# Patient Record
Sex: Male | Born: 2000 | Race: White | Hispanic: No | Marital: Single | Smoking: Current some day smoker
Health system: Southern US, Community
[De-identification: ages and names within clinical notes are randomized; demographics above are authoritative.]

## PROBLEM LIST (undated history)

## (undated) HISTORY — PX: MOUTH SURGERY: SHX715

---
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
## 2019-01-31 ENCOUNTER — Encounter: Payer: Self-pay | Admitting: Emergency Medicine

## 2019-01-31 ENCOUNTER — Other Ambulatory Visit: Payer: Self-pay

## 2019-01-31 ENCOUNTER — Ambulatory Visit
Admission: EM | Admit: 2019-01-31 | Discharge: 2019-01-31 | Disposition: A | Discharge status: Home / Self Care | Payer: Managed Care, Other (non HMO) | Attending: Urgent Care | Admitting: Urgent Care

## 2019-01-31 DIAGNOSIS — R5383 Other fatigue: Secondary | ICD-10-CM

## 2019-01-31 DIAGNOSIS — R51 Headache: Secondary | ICD-10-CM

## 2019-01-31 DIAGNOSIS — B349 Viral infection, unspecified: Secondary | ICD-10-CM

## 2019-01-31 DIAGNOSIS — Z7189 Other specified counseling: Secondary | ICD-10-CM

## 2019-01-31 DIAGNOSIS — R42 Dizziness and giddiness: Secondary | ICD-10-CM

## 2019-01-31 DIAGNOSIS — Z1159 Encounter for screening for other viral diseases: Secondary | ICD-10-CM

## 2019-01-31 DIAGNOSIS — R112 Nausea with vomiting, unspecified: Secondary | ICD-10-CM

## 2019-01-31 MED ORDER — ONDANSETRON 4 MG PO TBDP: 4.0000 mg | ORAL_TABLET | Freq: Three times a day (TID) | ORAL | 0 refills | Status: DC | PRN

## 2019-01-31 NOTE — Discharge Instructions (Addendum)
It was very nice seeing you today in clinic. Thank you for entrusting me with your care.   Rest and increase fluid intake as much as possible. Water is always best, as sugar and caffeine containing fluids can cause you to become dehydrated. Try to incorporate electrolyte enriched fluids, such as Gatorade or Pedialyte, into your daily fluid intake.  I have sent you some medication in for your nausea.   You were tested for SARS-CoV-2 (novel coronavirus) today. You need to self quarantine at home until negative tested results have been received.   Make arrangements to follow up with your regular doctor in 1 week for re-evaluation if not improving. If your symptoms/condition worsens, please seek follow up care either here or in the ER. Please remember, our [place] providers are "right here with you" when you need [place].   Again, it was my pleasure to take care of you today. Thank you for choosing our clinic. I hope that you start to feel better quickly.   Honor Loh, MSN, APRN, FNP-C, CEN Advanced Practice Provider [facility]

## 2019-01-31 NOTE — ED Provider Notes (Signed)
[place], [place]   Name: Mark Horne DOB: 10-Aug-2000 MRN: [id number] CSN: [id number] PCP: Clista BernhardtPa, [facility]  Arrival date and time:  01/31/19 0816  Chief Complaint:  Emesis, Dizziness, and Loss of Consciousness   NOTE: Prior to seeing the patient today, I have reviewed the triage nursing documentation and vital signs. Clinical staff has updated patient's PMH/PSHx, current medication list, and drug allergies/intolerances to ensure comprehensive history available to assist in medical decision making.   History:   HPI: Mark MoleJesse G Horne is a 18 y.o. male who presents today with complaints of "just not feeling good" that began with acute onset around 0 130 this morning.  Patient reports that he is dizzy and lightheaded.  He has experienced nausea and 4-5 episodes of vomiting.  He denies any diarrhea or fevers.  Patient reports that he was sitting in the chair at work and "blacked out" for what he feels was around 10 minutes. He did not fall to the floor or hit his head.  Patient complains of headache behind his eyes and slight pain in his RIGHT ear.  He denies any associated chest pain, shortness of breath, or palpitations.  Feels as if symptoms are related to his work schedule.  He works third shift and then directly goes to a first shift job 7 days a week.  He is only sleeping 3 to 4 hours a day.  Patient denies any sick contacts.  He initially refuses to be tested for SARS-CoV-2 (novel coronavirus) finding that he does not have insurance.  History reviewed. No pertinent past medical history.  Past Surgical History:  Procedure Laterality Date  . MOUTH SURGERY      Family History  Problem Relation Age of Onset  . Healthy Mother   . CAD Father 1638       CABG x 4  . Hypertension Father   . CAD Paternal Grandfather 465       CABG x 2  . CAD Paternal Uncle        CABG x 1    Social History   Tobacco Use  . Smoking status: Current Some Day Smoker    Packs/day: 1.00    Types:  Cigarettes  . Smokeless tobacco: Former Engineer, waterUser  Substance Use Topics  . Alcohol use: Yes    Alcohol/week: 2.0 standard drinks    Types: 2 Cans of beer per week    Comment: rarely  . Drug use: Never    There are no active problems to display for this patient.   Home Medications:    No outpatient medications have been marked as taking for the 01/31/19 encounter [facility](Hospital Encounter).    Allergies:   Nickel  Review of Systems (ROS): Review of Systems  Constitutional: Positive for fatigue. Negative for chills and fever.  HENT: Positive for ear pain. Negative for congestion, rhinorrhea, sinus pressure, sinus pain and sore throat.   Eyes: Negative for visual disturbance.  Respiratory: Negative for cough and shortness of breath.   Cardiovascular: Negative for chest pain and palpitations.  Gastrointestinal: Positive for nausea and vomiting. Negative for abdominal pain and diarrhea.  Neurological: Positive for dizziness, syncope (did not fall to floor; did not hit head), light-headedness and headaches.  Psychiatric/Behavioral: Positive for sleep disturbance (2/2 work schedule).  All other systems reviewed and are negative.    Vital Signs: Today's Vitals   01/31/19 0826 01/31/19 0827 01/31/19 0828  BP:   136/75  Pulse:   90  Resp:   18  Temp:   98.2 F (36.8 C)  TempSrc:   Oral  SpO2:   97%  Weight:  180 lb (81.6 kg)   Height:  5\' 11"  (1.803 m)   PainSc: 3       Physical Exam: Physical Exam  Constitutional: He is oriented to person, place, and time and well-developed, well-nourished, and in no distress. No distress.  HENT:  Head: Normocephalic and atraumatic.  Right Ear: Hearing, external ear and ear canal normal. No tenderness. Tympanic membrane is injected.  Left Ear: Hearing, tympanic membrane, external ear and ear canal normal. No tenderness. Tympanic membrane is not injected.  Nose: Nose normal.  Mouth/Throat: Oropharynx is clear and moist.  Eyes: Pupils are equal,  round, and reactive to light. Conjunctivae and EOM are normal.  Neck: Normal range of motion. Neck supple. No tracheal deviation present.  Cardiovascular: Normal rate, regular rhythm, normal heart sounds and intact distal pulses. Exam reveals no gallop and no friction rub.  No murmur heard. Pulmonary/Chest: Effort normal and breath sounds normal. No respiratory distress. He has no wheezes. He has no rales.  Abdominal: Soft. Bowel sounds are normal. He exhibits no distension. There is no abdominal tenderness.  Musculoskeletal: Normal range of motion.  Lymphadenopathy:    He has no cervical adenopathy.  Neurological: He is alert and oriented to person, place, and time. Gait normal.  Skin: Skin is warm and dry. No rash noted. He is not diaphoretic.  Psychiatric: Mood, memory, affect and judgment normal.  Nursing note and vitals reviewed.   Urgent Care Treatments / Results:   LABS: PLEASE NOTE: all labs that were ordered this encounter are listed, however only abnormal results are displayed. Labs Reviewed  NOVEL CORONAVIRUS, NAA (HOSP ORDER, SEND-OUT TO REF LAB; TAT 18-24 HRS)    EKG: -None  RADIOLOGY: No results found.  PROCEDURES: Procedures  MEDICATIONS RECEIVED THIS VISIT: Medications - No data to display  PERTINENT CLINICAL COURSE NOTES/UPDATES:   Initial Impression / Assessment and Plan / Urgent Care Course:  Pertinent labs & imaging results that were available during my care of the patient were personally reviewed by me and considered in my medical decision making (see lab/imaging section of note for values and interpretations).  Mark Horne is a 18 y.o. male who presents to [facility] today with complaints of dizziness, nausea, and vomiting.   Patient overall well appearing and in no acute distress today in clinic. Presenting symptoms (see HPI) and exam as documented above. He presents with symptoms associated with SARS-CoV-2 (novel coronavirus). Discussed  typical symptom constellation. Reviewed potential for infection and need for testing. Patient amenable to being tested. SARS-CoV-2 swab collected by certified clinical staff. Discussed variable turn around times associated with testing, as swabs are being processed at [facility], and have been taking between 2-5 days to come back. He was advised to self quarantine, per [facility] DHHS guidelines, until negative results received.   VS normal today in clinic. He denies vertiginous symptoms associated with position changes. Patient eating and drinking well. Suspect viral illness, of which SARS-CoV-2 (novel coronavirus) remains part of the differential. Discussed supportive care measures at home during acute phase of illness. Patient to rest as much as possible. He was encouraged to ensure adequate hydration (water and ORS) to prevent dehydration and electrolyte derangements. Patient may use APAP and/or IBU on an as needed basis for fevers. Will sent in a PRN supply of ondansetron for nausea. Recommended bland diet to be advanced as tolerated.  Discussed follow up with primary care physician in 1 week for re-evaluation. I have reviewed the follow up and strict return precautions for any new or worsening symptoms. Patient is aware of symptoms that would be deemed urgent/emergent, and would thus require further evaluation either here or in the emergency department. At the time of discharge, he verbalized understanding and consent with the discharge plan as it was reviewed with him. All questions were fielded by provider and/or clinic staff prior to patient discharge.    Final Clinical Impressions / Urgent Care Diagnoses:   Final diagnoses:  Advice Given About Covid-19 Virus Infection  Encounter for screening for other viral diseases  Viral illness  Nausea and vomiting, intractability of vomiting not specified, unspecified vomiting type    New Prescriptions:  [place] Controlled Substance Registry consulted? Not Applicable   Meds ordered this encounter  Medications  . ondansetron (ZOFRAN-ODT) 4 MG disintegrating tablet    Sig: Take 1 tablet (4 mg total) by mouth every 8 (eight) hours as needed.    Dispense:  15 tablet    Refill:  0    Recommended Follow up Care:  Patient encouraged to follow up with the following provider within the specified time frame, or sooner as dictated by the severity of his symptoms. As always, he was instructed that for any urgent/emergent care needs, he should seek care either here or in the emergency department for more immediate evaluation.  Follow-up Information    Pa, [facility] In 1 week.   Why: General reassessment of symptoms if not improving Contact information: [address] [place] [id number] [phone]         NOTE: This note was prepared using Dragon dictation software along with smaller phrase technology. Despite my best ability to proofread, there is the potential that transcriptional errors may still occur from this process, and are completely unintentional.    Karen Kitchens, NP 01/31/19 2057

## 2019-01-31 NOTE — ED Triage Notes (Signed)
Patient states he was at work last night and started feeling dizzy and light headed. He stated he started vomiting and then states he passed out for over 10 minutes.

## 2019-02-02 LAB — NOVEL CORONAVIRUS, NAA (HOSP ORDER, SEND-OUT TO REF LAB; TAT 18-24 HRS): SARS-CoV-2, NAA: NOT DETECTED

## 2019-02-27 ENCOUNTER — Other Ambulatory Visit: Payer: Self-pay

## 2019-02-27 ENCOUNTER — Ambulatory Visit: Payer: Self-pay | Admitting: Physician Assistant

## 2019-02-27 DIAGNOSIS — Z113 Encounter for screening for infections with a predominantly sexual mode of transmission: Secondary | ICD-10-CM

## 2019-02-27 DIAGNOSIS — Z202 Contact with and (suspected) exposure to infections with a predominantly sexual mode of transmission: Secondary | ICD-10-CM

## 2019-02-27 LAB — GRAM STAIN

## 2019-02-27 MED ORDER — AZITHROMYCIN 500 MG PO TABS
1000.0000 mg | ORAL_TABLET | Freq: Once | ORAL | Status: AC
  Administered 2019-02-27: 17:00:00 1000 mg via ORAL

## 2019-02-27 MED ORDER — CEFTRIAXONE SODIUM 250 MG IJ SOLR
250.0000 mg | Freq: Once | INTRAMUSCULAR | Status: AC
  Administered 2019-02-27: 250 mg via INTRAMUSCULAR

## 2019-02-28 ENCOUNTER — Encounter: Payer: Self-pay | Admitting: Physician Assistant

## 2019-02-28 NOTE — Progress Notes (Signed)
    STI clinic/screening visit  Subjective:  Mark Horne is a 18 y.o. male being seen today for an STI screening visit. The patient reports they do have symptoms.  Patient has the following medical conditions:  There are no active problems to display for this patient.    Chief Complaint  Patient presents with  . SEXUALLY TRANSMITTED DISEASE    HPI  Patient reports that he was notified by a now former partner that he is a contact to [facility] and Chlamydia.  States that he noticed a clear/yellow discharge this am.  Declines blood work today.  See flowsheet for further details and programmatic requirements.    The following portions of the patient's history were reviewed and updated as appropriate: allergies, current medications, past medical history, past social history, past surgical history and problem list.  Objective:  There were no vitals filed for this visit.  Physical Exam Constitutional:      General: He is not in acute distress.    Appearance: Normal appearance.  HENT:     Head: Normocephalic and atraumatic.     Mouth/Throat:     Mouth: Mucous membranes are moist.     Pharynx: Oropharynx is clear. No oropharyngeal exudate or posterior oropharyngeal erythema.  Eyes:     Conjunctiva/sclera: Conjunctivae normal.  Neck:     Musculoskeletal: Neck supple.  Pulmonary:     Effort: Pulmonary effort is normal.  Abdominal:     Palpations: Abdomen is soft. There is no mass.     Tenderness: There is no abdominal tenderness. There is no guarding or rebound.  Genitourinary:    Penis: Normal.      Scrotum/Testes: Normal.     Comments: Pubic area without nits, lice, edema, erythema, lesions and inguinal adenopathy. Penis circumcised and without discharge at meatus. Lymphadenopathy:     Cervical: No cervical adenopathy.  Skin:    General: Skin is warm and dry.     Findings: No bruising, erythema, lesion or rash.  Neurological:     Mental Status: He is alert and oriented to  person, place, and time.  Psychiatric:        Mood and Affect: Mood normal.        Behavior: Behavior normal.        Thought Content: Thought content normal.        Judgment: Judgment normal.       Assessment and Plan:  Mark Horne is a 18 y.o. male presenting to the [facility] Department for STI screening  1. Screening for STD (sexually transmitted disease) Patient into clinic with symptoms and declines blood work today. Rec condoms with all sex. - Gram stain - Gonococcus culture  2. Venereal disease contact Treat as a contact to Chlamydia and GC today with Azithromycin 1 g po DOT and Ceftriaxone 250 mg IM today. No sex for 7 days and until after partner completes treatment. RTC if vomits < 2 hr after taking medicine for re-treatment. - azithromycin (ZITHROMAX) tablet 1,000 mg - cefTRIAXone (ROCEPHIN) injection 250 mg     Return if symptoms worsen or fail to improve.  No future appointments.  Jerene Dilling, PA

## 2019-02-28 NOTE — Progress Notes (Signed)
Gram stain reviewed and pt treated for NGU and contact to GC/Chlamydia per SO and per Antoine Primas, PA order. Pt tolerated well. Provider orders completed.Ronny Bacon, RN

## 2019-03-04 LAB — GONOCOCCUS CULTURE

## 2020-12-03 ENCOUNTER — Ambulatory Visit (INDEPENDENT_AMBULATORY_CARE_PROVIDER_SITE_OTHER): Payer: Self-pay

## 2020-12-03 ENCOUNTER — Other Ambulatory Visit: Payer: Self-pay

## 2020-12-03 ENCOUNTER — Ambulatory Visit
Admission: EM | Admit: 2020-12-03 | Discharge: 2020-12-03 | Disposition: A | Discharge status: Home / Self Care | Payer: Self-pay | Attending: Physician Assistant | Admitting: Physician Assistant

## 2020-12-03 DIAGNOSIS — J189 Pneumonia, unspecified organism: Secondary | ICD-10-CM | POA: Insufficient documentation

## 2020-12-03 DIAGNOSIS — H9203 Otalgia, bilateral: Secondary | ICD-10-CM | POA: Insufficient documentation

## 2020-12-03 DIAGNOSIS — F17210 Nicotine dependence, cigarettes, uncomplicated: Secondary | ICD-10-CM | POA: Insufficient documentation

## 2020-12-03 DIAGNOSIS — M791 Myalgia, unspecified site: Secondary | ICD-10-CM | POA: Insufficient documentation

## 2020-12-03 DIAGNOSIS — J22 Unspecified acute lower respiratory infection: Secondary | ICD-10-CM | POA: Insufficient documentation

## 2020-12-03 DIAGNOSIS — H9213 Otorrhea, bilateral: Secondary | ICD-10-CM | POA: Insufficient documentation

## 2020-12-03 DIAGNOSIS — R0602 Shortness of breath: Secondary | ICD-10-CM

## 2020-12-03 DIAGNOSIS — R112 Nausea with vomiting, unspecified: Secondary | ICD-10-CM | POA: Insufficient documentation

## 2020-12-03 DIAGNOSIS — R000 Tachycardia, unspecified: Secondary | ICD-10-CM

## 2020-12-03 DIAGNOSIS — Z8616 Personal history of COVID-19: Secondary | ICD-10-CM | POA: Insufficient documentation

## 2020-12-03 DIAGNOSIS — Z28310 Unvaccinated for covid-19: Secondary | ICD-10-CM | POA: Insufficient documentation

## 2020-12-03 DIAGNOSIS — Z20822 Contact with and (suspected) exposure to covid-19: Secondary | ICD-10-CM | POA: Insufficient documentation

## 2020-12-03 LAB — RESP PANEL BY RT-PCR (FLU A&B, COVID) ARPGX2
Influenza A by PCR: NEGATIVE
Influenza B by PCR: NEGATIVE
SARS Coronavirus 2 by RT PCR: NEGATIVE

## 2020-12-03 MED ORDER — AMOXICILLIN 500 MG PO CAPS: 1000.0000 mg | ORAL_CAPSULE | Freq: Three times a day (TID) | ORAL | 0 refills | Status: AC

## 2020-12-03 MED ORDER — ALBUTEROL SULFATE HFA 108 (90 BASE) MCG/ACT IN AERS: 1.0000 | INHALATION_SPRAY | Freq: Four times a day (QID) | RESPIRATORY_TRACT | 0 refills | Status: DC | PRN

## 2020-12-03 MED ORDER — ONDANSETRON 4 MG PO TBDP: 4.0000 mg | ORAL_TABLET | Freq: Four times a day (QID) | ORAL | 0 refills | Status: DC | PRN

## 2020-12-03 MED ORDER — IBUPROFEN 800 MG PO TABS: 800.0000 mg | ORAL_TABLET | Freq: Three times a day (TID) | ORAL | 0 refills | Status: DC | PRN

## 2020-12-03 NOTE — Discharge Instructions (Addendum)
Your flu and COVID tests are negative.  I would test result for COVID-19 again in 1 to 2 days.  If you are positive yet to be isolated 5 days from symptom onset and then wear mask for 5 days.  A chest x-ray was performed today which shows a possibility of mild pneumonia.  I have sent in antibiotics for you.  Have also sent an inhaler to help with your shortness of breath and Zofran for nausea and vomiting.  Additionally a symptom ibuprofen 800 milligrams tablets for your achiness and headaches.  Increase rest and fluids.  If you develop a cough, take Robitussin.  For any severe increase/worsening of your symptoms such as worsening breathing or pain in your chest you should call 911 or also taken to the emergency department.  You should start to feel better in the next few days.  Follow-up with PCP if you are still having symptoms in a week or return here for any worsening symptoms.  Go to ED for any severe acute worsening symptoms.

## 2020-12-03 NOTE — ED Provider Notes (Signed)
[facility]    CSN: [id number] Arrival date & time: 12/03/20  0815      History   Chief Complaint No chief complaint on file.   HPI Mark Horne is a 20 y.o. male presenting for sudden onset of fatigue, feeling feverish, body aches, headaches, bilateral ear pain and pressure, shortness of breath/difficulty breathing last night.  Patient denies any recorded temperatures and says he is felt hot and then cold at times.  He did take Aleve yesterday for aches and suspected fever but has not taken anything today.  Temperature is currently 99.7 degrees.  He denies any cough but states he is starting to get a little congested.  No sore throat or chest pain.  He has felt nauseous without any vomiting and no diarrhea.  No sick contacts and no known exposure to COVID-19.  Patient says he was vaccinated x1 with either [employer] or [place].  States he got lazy and never got the second vaccine.  He does admit to personal history of COVID-19 approximately 1.5 to 2 years ago.  Patient says that he had similar symptoms when he had COVID before including feeling short of breath.  He says he was never hospitalized.  Patient denies any history of asthma.  He says he feels short of breath whenever he is wearing his mask and at rest as well as walking.  No difference with being at rest or exertion.  Admits to occasional chest tightness on breathing.  Says that chest tightness is in the center of his chest.  Not experiencing this now.  Patient denies any recent travel, sedentary lifestyle, surgeries, blood clotting disorders or trauma.  No other complaints or concerns.  HPI  History reviewed. No pertinent past medical history.  There are no problems to display for this patient.   Past Surgical History:  Procedure Laterality Date   MOUTH SURGERY         Home Medications    Prior to Admission medications   Medication Sig Start Date End Date Taking? Authorizing Provider  albuterol (VENTOLIN HFA)  108 (90 Base) MCG/ACT inhaler Inhale 1-2 puffs into the lungs every 6 (six) hours as needed for up to 10 days for wheezing or shortness of breath. 12/03/20 12/13/20 Yes Eusebio Friendly B, PA-C  amoxicillin (AMOXIL) 500 MG capsule Take 2 capsules (1,000 mg total) by mouth 3 (three) times daily for 7 days. 12/03/20 12/10/20 Yes Shirlee Latch, PA-C  ibuprofen (ADVIL) 800 MG tablet Take 1 tablet (800 mg total) by mouth every 8 (eight) hours as needed for headache, fever, mild pain or moderate pain. 12/03/20  Yes Eusebio Friendly B, PA-C  ondansetron (ZOFRAN-ODT) 4 MG disintegrating tablet Take 1 tablet (4 mg total) by mouth every 6 (six) hours as needed for nausea or vomiting. 12/03/20   Shirlee Latch, PA-C    Family History Family History  Problem Relation Age of Onset   Healthy Mother    CAD Father 2       CABG x 4   Hypertension Father    CAD Paternal Grandfather 86       CABG x 2   CAD Paternal Uncle        CABG x 1    Social History Social History   Tobacco Use   Smoking status: Some Days    Packs/day: 1.00    Types: Cigarettes   Smokeless tobacco: Former  Building services engineer Use: Every day  Substance Use Topics  Alcohol use: Yes    Alcohol/week: 2.0 standard drinks    Types: 2 Cans of beer per week    Comment: rarely   Drug use: Never     Allergies   Nickel   Review of Systems Review of Systems  Constitutional:  Positive for fatigue. Negative for fever.  HENT:  Positive for congestion and ear pain. Negative for rhinorrhea, sinus pressure, sinus pain and sore throat.   Respiratory:  Positive for shortness of breath. Negative for cough.   Cardiovascular:  Negative for chest pain.  Gastrointestinal:  Positive for nausea. Negative for abdominal pain, diarrhea and vomiting.  Musculoskeletal:  Positive for myalgias.  Neurological:  Positive for headaches. Negative for weakness and light-headedness.  Hematological:  Negative for adenopathy.    Physical Exam Triage Vital  Signs ED Triage Vitals  Enc Vitals Group     BP 12/03/20 0834 122/79     Pulse Rate 12/03/20 0834 (!) 125     Resp 12/03/20 0834 18     Temp 12/03/20 0834 99.7 F (37.6 C)     Temp Source 12/03/20 0834 Oral     SpO2 12/03/20 0834 97 %     Weight 12/03/20 0833 240 lb (108.9 kg)     Height 12/03/20 0833 5\' 10"  (1.778 m)     Head Circumference --      Peak Flow --      Pain Score 12/03/20 0831 5     Pain Loc --      Pain Edu? --      Excl. in [place]? --    No data found.  Updated Vital Signs BP 122/79 (BP Location: Right Arm)   Pulse (!) 125   Temp 99.7 F (37.6 C) (Oral)   Resp 18   Ht 5\' 10"  (1.778 m)   Wt 240 lb (108.9 kg)   SpO2 97%   BMI 34.44 kg/m      Physical Exam Vitals and nursing note reviewed.  Constitutional:      General: He is not in acute distress.    Appearance: Normal appearance. He is well-developed. He is ill-appearing. He is not toxic-appearing or diaphoretic.  HENT:     Head: Normocephalic and atraumatic.     Right Ear: A middle ear effusion is present. Tympanic membrane is injected.     Left Ear: A middle ear effusion is present.     Nose: Congestion present.     Mouth/Throat:     Mouth: Mucous membranes are moist.     Pharynx: Oropharynx is clear. Uvula midline. No oropharyngeal exudate.     Tonsils: No tonsillar abscesses.  Eyes:     General: No scleral icterus.       Right eye: No discharge.        Left eye: No discharge.     Conjunctiva/sclera: Conjunctivae normal.  Neck:     Thyroid: No thyromegaly.     Trachea: No tracheal deviation.  Cardiovascular:     Rate and Rhythm: Regular rhythm. Tachycardia present.     Heart sounds: Normal heart sounds.  Pulmonary:     Effort: Pulmonary effort is normal. No respiratory distress.     Breath sounds: Normal breath sounds. No wheezing, rhonchi or rales.  Musculoskeletal:     Cervical back: Neck supple.  Skin:    General: Skin is warm and dry.     Findings: No rash.  Neurological:      General: No focal deficit present.  Mental Status: He is alert. Mental status is at baseline.     Motor: No weakness.     Gait: Gait normal.  Psychiatric:        Mood and Affect: Mood normal.        Behavior: Behavior normal.        Thought Content: Thought content normal.     UC Treatments / Results  Labs (all labs ordered are listed, but only abnormal results are displayed) Labs Reviewed  RESP PANEL BY RT-PCR (FLU A&B, COVID) [id number]    EKG   Radiology DG Chest 2 View  Result Date: 12/03/2020 CLINICAL DATA:  Shortness of breath.  Tachycardia.  Body aches. EXAM: CHEST - 2 VIEW COMPARISON:  Chest x-ray report 03/24/2017. FINDINGS: Mediastinum and hilar structures normal. Heart size normal. Low lung volumes. Mild posterior basilar infiltrate cannot be excluded on lateral view. No pleural effusion or pneumothorax. Degenerative change thoracic spine. IMPRESSION: Low lung volumes. Mild posterior basilar infiltrate cannot be excluded on lateral view. Electronically Signed   By: Maisie Fus  Register   On: 12/03/2020 10:16    Procedures Procedures (including critical care time)  Medications Ordered in UC Medications - No data to display  Initial Impression / Assessment and Plan / UC Course  I have reviewed the triage vital signs and the nursing notes.  Pertinent labs & imaging results that were available during my care of the patient were reviewed by me and considered in my medical decision making (see chart for details).  20 year old male presenting for sudden onset of feeling feverish, fatigue, headaches and body aches, bilateral ear pain, nausea and congestion.  Symptoms began yesterday and are worsening today.  Temperature is 99.7 degrees.  Pulse is elevated at 125 bpm.  Oxygen saturation 97% and normal respiratory rate.  He is in no acute distress.  He is ill-appearing but nontoxic.  Exam significant for effusion of bilateral TMs that is mild and injection of the right TM that is  mild.  Also has minor nasal congestion/mucosal edema.  Chest is clear to auscultation.  Respiratory panel obtained to assess for influenza or COVID-19.  I did offer Zofran ODT for nausea but patient says its not that bad and just requested an emesis bag.  Reviewed results of respiratory panel which was negative for influenza and COVID-19 with patient.  He did have a emesis bag about one third full with emesis.  Again I offered him Zofran but he declined stating that he felt better after he vomited.  Ordering chest x-ray at this time due to patient's complaint of shortness of breath, fatigue and elevated pulse rate.  Assessing for possible underlying pneumonia.  X-ray shows possible mild posterior basilar infiltrate.  Reviewed this result with patient.  Treating him for community-acquired pneumonia.  He is young and otherwise healthy so I sent in amoxicillin.  Also sent an inhaler, ibuprofen and Zofran for nausea and vomiting.  Reviewed return and ED precautions.   Final Clinical Impressions(s) / UC Diagnoses   Final diagnoses:  Lower respiratory infection  Shortness of breath  Non-intractable vomiting with nausea, unspecified vomiting type  Myalgia     Discharge Instructions      Your flu and COVID tests are negative.  I would test result for COVID-19 again in 1 to 2 days.  If you are positive yet to be isolated 5 days from symptom onset and then wear mask for 5 days.  A chest x-ray was performed today which shows a possibility  of mild pneumonia.  I have sent in antibiotics for you.  Have also sent an inhaler to help with your shortness of breath and Zofran for nausea and vomiting.  Additionally a symptom ibuprofen 800 milligrams tablets for your achiness and headaches.  Increase rest and fluids.  If you develop a cough, take Robitussin.  For any severe increase/worsening of your symptoms such as worsening breathing or pain in your chest you should call 911 or also taken to the emergency  department.  You should start to feel better in the next few days.  Follow-up with PCP if you are still having symptoms in a week or return here for any worsening symptoms.  Go to ED for any severe acute worsening symptoms.     ED Prescriptions     Medication Sig Dispense Auth. Provider   ondansetron (ZOFRAN-ODT) 4 MG disintegrating tablet Take 1 tablet (4 mg total) by mouth every 6 (six) hours as needed for nausea or vomiting. 15 tablet Eusebio FriendlyEaves, Kynsleigh Westendorf B, PA-C   albuterol (VENTOLIN HFA) 108 (90 Base) MCG/ACT inhaler Inhale 1-2 puffs into the lungs every 6 (six) hours as needed for up to 10 days for wheezing or shortness of breath. 1 g Eusebio FriendlyEaves, Jonai Weyland B, PA-C   ibuprofen (ADVIL) 800 MG tablet Take 1 tablet (800 mg total) by mouth every 8 (eight) hours as needed for headache, fever, mild pain or moderate pain. 21 tablet Eusebio FriendlyEaves, Michie Molnar B, PA-C   amoxicillin (AMOXIL) 500 MG capsule Take 2 capsules (1,000 mg total) by mouth 3 (three) times daily for 7 days. 42 capsule Shirlee LatchEaves, Makalyn Lennox B, PA-C      PDMP not reviewed this encounter.   Shirlee Latchaves, Lawanna Cecere B, PA-C 12/03/20 1031

## 2020-12-03 NOTE — ED Triage Notes (Signed)
Pt states last night sudden onset with body aches, hard to breath, feeling feverish, ear pain. Took at home Covid, was negative.

## 2021-05-07 ENCOUNTER — Other Ambulatory Visit: Payer: Self-pay

## 2021-05-07 ENCOUNTER — Encounter: Payer: Self-pay | Admitting: Emergency Medicine

## 2021-05-07 ENCOUNTER — Ambulatory Visit
Admission: EM | Admit: 2021-05-07 | Discharge: 2021-05-07 | Disposition: A | Discharge status: Home / Self Care | Payer: Self-pay

## 2021-05-07 DIAGNOSIS — R197 Diarrhea, unspecified: Secondary | ICD-10-CM

## 2021-05-07 DIAGNOSIS — R142 Eructation: Secondary | ICD-10-CM

## 2021-05-07 DIAGNOSIS — R112 Nausea with vomiting, unspecified: Secondary | ICD-10-CM

## 2021-05-07 DIAGNOSIS — R1084 Generalized abdominal pain: Secondary | ICD-10-CM

## 2021-05-07 MED ORDER — OMEPRAZOLE 20 MG PO CPDR: 20.0000 mg | DELAYED_RELEASE_CAPSULE | Freq: Every day | ORAL | 2 refills | Status: DC

## 2021-05-07 MED ORDER — ONDANSETRON 8 MG PO TBDP: 8.0000 mg | ORAL_TABLET | Freq: Three times a day (TID) | ORAL | 0 refills | Status: DC | PRN

## 2021-05-07 NOTE — ED Provider Notes (Signed)
[facility]    CSN: [id number] Arrival date & time: 05/07/21  1326      History   Chief Complaint Chief Complaint  Patient presents with   Abdominal Pain   Vomiting    HPI Mark Horne is a 21 y.o. male.   HPI  21 year old male here for evaluation of GI issues.  Patient reports that he has been experiencing intermittent symptoms for the last 6 to 7 months with continuous symptoms for last 3 to 4 months that have increased over the last 3 weeks.  His symptoms include daily abdominal pain around the middle of his abdomen, nausea every morning that is associate with vomiting, diarrhea whenever he eats, and frequent belching throughout the day.  The bulging his intensified last 3 weeks here.  This not associated with any particular type of food.  Patient denies fever, cough, or sour taste in his mouth.  History reviewed. No pertinent past medical history.  There are no problems to display for this patient.   Past Surgical History:  Procedure Laterality Date   MOUTH SURGERY         Home Medications    Prior to Admission medications   Medication Sig Start Date End Date Taking? Authorizing Provider  omeprazole (PRILOSEC) 20 MG capsule Take 1 capsule (20 mg total) by mouth daily. 05/07/21  Yes Becky Augusta, NP  ondansetron (ZOFRAN-ODT) 8 MG disintegrating tablet Take 1 tablet (8 mg total) by mouth every 8 (eight) hours as needed for nausea or vomiting. 05/07/21  Yes Becky Augusta, NP  Probiotic Product (PROBIOTIC-10 PO) Take by mouth.   Yes [provider]  albuterol (VENTOLIN HFA) 108 (90 Base) MCG/ACT inhaler Inhale 1-2 puffs into the lungs every 6 (six) hours as needed for up to 10 days for wheezing or shortness of breath. 12/03/20 12/13/20  Eusebio Friendly B, PA-C  ibuprofen (ADVIL) 800 MG tablet Take 1 tablet (800 mg total) by mouth every 8 (eight) hours as needed for headache, fever, mild pain or moderate pain. 12/03/20   Shirlee Latch, PA-C    Family  History Family History  Problem Relation Age of Onset   Healthy Mother    CAD Father 77       CABG x 4   Hypertension Father    CAD Paternal Grandfather 88       CABG x 2   CAD Paternal Uncle        CABG x 1    Social History Social History   Tobacco Use   Smoking status: Some Days    Packs/day: 1.00    Types: Cigarettes   Smokeless tobacco: Former  Building services engineer Use: Every day  Substance Use Topics   Alcohol use: Yes    Alcohol/week: 2.0 standard drinks    Types: 2 Cans of beer per week    Comment: rarely   Drug use: Never     Allergies   Nickel   Review of Systems Review of Systems  Constitutional:  Negative for activity change, appetite change and fever.  Gastrointestinal:  Positive for abdominal distention, abdominal pain, diarrhea, nausea and vomiting.       Belching  Hematological: Negative.   Psychiatric/Behavioral: Negative.      Physical Exam Triage Vital Signs ED Triage Vitals  Enc Vitals Group     BP 05/07/21 1415 130/88     Pulse Rate 05/07/21 1415 83     Resp 05/07/21 1415 16  Temp 05/07/21 1415 98.9 F (37.2 C)     Temp Source 05/07/21 1415 Oral     SpO2 05/07/21 1415 95 %     Weight --      Height --      Head Circumference --      Peak Flow --      Pain Score 05/07/21 1413 3     Pain Loc --      Pain Edu? --      Excl. in [place]? --    No data found.  Updated Vital Signs BP 130/88    Pulse 83    Temp 98.9 F (37.2 C) (Oral)    Resp 16    SpO2 95%   Visual Acuity Right Eye Distance:   Left Eye Distance:   Bilateral Distance:    Right Eye Near:   Left Eye Near:    Bilateral Near:     Physical Exam Vitals and nursing note reviewed.  Constitutional:      General: He is not in acute distress.    Appearance: Normal appearance. He is not ill-appearing.  HENT:     Head: Normocephalic and atraumatic.  Cardiovascular:     Rate and Rhythm: Normal rate and regular rhythm.     Pulses: Normal pulses.     Heart sounds:  Normal heart sounds. No murmur heard.   No friction rub. No gallop.  Pulmonary:     Effort: Pulmonary effort is normal.     Breath sounds: Normal breath sounds. No wheezing, rhonchi or rales.  Abdominal:     General: Bowel sounds are normal. There is no distension.     Palpations: Abdomen is soft.     Tenderness: There is abdominal tenderness. There is no guarding or rebound.  Skin:    General: Skin is warm and dry.     Capillary Refill: Capillary refill takes less than 2 seconds.     Findings: No erythema or rash.  Neurological:     General: No focal deficit present.     Mental Status: He is alert and oriented to person, place, and time.  Psychiatric:        Mood and Affect: Mood normal.        Behavior: Behavior normal.        Thought Content: Thought content normal.        Judgment: Judgment normal.     UC Treatments / Results  Labs (all labs ordered are listed, but only abnormal results are displayed) Labs Reviewed - No data to display  EKG   Radiology No results found.  Procedures Procedures (including critical care time)  Medications Ordered in UC Medications - No data to display  Initial Impression / Assessment and Plan / UC Course  I have reviewed the triage vital signs and the nursing notes.  Pertinent labs & imaging results that were available during my care of the patient were reviewed by me and considered in my medical decision making (see chart for details).  Patient is a nontoxic-appearing 21 year old male here for evaluation of GI complaints that consist of daily abdominal pain that was off and on for 6 to 7 months and has become daily in the last 3 to 4 months.  In last 3 weeks it has intensified more.  This is associated with nausea and vomiting every morning, diarrhea whenever he eats, and now in the last 3 weeks he is having frequent abdominal distention and belching.  He denies any fever,  sour taste in his mouth, sore throat, or cough.  He states that  his symptoms are not related to what type of foods he eats as he has made different food adjustments over the last several months without any change in symptoms.  He has never seen gastroenterology.  Patient's physical exam reveals a benign cardiopulmonary exam with clear lung sounds in all fields.  Abdomen is protuberant but soft with positive bowel sounds in all 4 quadrants.  He has mild generalized abdominal tenderness without guarding or rebound.  Patient symptoms are consistent with possible SIBO versus H. pylori versus GERD.  Will refer patient to [facility] and discharged him home on omeprazole 20 mg at bedtime and Zofran 8 mg every 8 hours as needed for nausea.   Final Clinical Impressions(s) / UC Diagnoses   Final diagnoses:  Generalized abdominal pain  Belching symptom  Nausea vomiting and diarrhea     Discharge Instructions      As we discussed, you need to see gastroenterology to have an evaluation for possible H. pylori, SIBO, or reflux disease as the cause to your abdominal symptoms.  Take Zofran every 8 hours as needed for nausea and vomiting.  Take the omeprazole 20 mg at bedtime nightly.  I have made referral to [facility] and they will call you to schedule an appointment.     ED Prescriptions     Medication Sig Dispense Auth. Provider   ondansetron (ZOFRAN-ODT) 8 MG disintegrating tablet Take 1 tablet (8 mg total) by mouth every 8 (eight) hours as needed for nausea or vomiting. 20 tablet Margarette Canada, NP   omeprazole (PRILOSEC) 20 MG capsule Take 1 capsule (20 mg total) by mouth daily. 30 capsule Margarette Canada, NP      PDMP not reviewed this encounter.   Margarette Canada, NP 05/07/21 1500

## 2021-05-07 NOTE — ED Triage Notes (Signed)
PT reports abdominal pain daily, he is nauseated every morning and has vomited every morning for several weeks.   Initially, pain and nausea were intermittent, they are now more consistent.   Reports he has diarrhea every time he eats.

## 2021-05-07 NOTE — Discharge Instructions (Addendum)
As we discussed, you need to see gastroenterology to have an evaluation for possible H. pylori, SIBO, or reflux disease as the cause to your abdominal symptoms.  Take Zofran every 8 hours as needed for nausea and vomiting.  Take the omeprazole 20 mg at bedtime nightly.  I have made referral to [facility] and they will call you to schedule an appointment.

## 2021-05-27 ENCOUNTER — Other Ambulatory Visit: Payer: Self-pay

## 2021-05-27 ENCOUNTER — Ambulatory Visit (INDEPENDENT_AMBULATORY_CARE_PROVIDER_SITE_OTHER): Payer: Self-pay | Admitting: Gastroenterology

## 2021-05-27 ENCOUNTER — Encounter: Payer: Self-pay | Admitting: Gastroenterology

## 2021-05-27 VITALS — BP 116/76 | HR 73 | Temp 98.2°F | Ht 71.0 in | Wt 253.6 lb

## 2021-05-27 DIAGNOSIS — R1013 Epigastric pain: Secondary | ICD-10-CM

## 2021-05-27 DIAGNOSIS — R197 Diarrhea, unspecified: Secondary | ICD-10-CM

## 2021-05-27 DIAGNOSIS — F1290 Cannabis use, unspecified, uncomplicated: Secondary | ICD-10-CM

## 2021-05-27 MED ORDER — OMEPRAZOLE 40 MG PO CPDR: 40.0000 mg | DELAYED_RELEASE_CAPSULE | Freq: Two times a day (BID) | ORAL | 3 refills | Status: AC

## 2021-05-27 MED ORDER — NA SULFATE-K SULFATE-MG SULF 17.5-3.13-1.6 GM/177ML PO SOLN: 354.0000 mL | Freq: Once | ORAL | 0 refills | Status: AC

## 2021-05-27 NOTE — Progress Notes (Signed)
Jonathon Bellows MD, MRCP(U.K) [address]  [place]  [place], [place] [address]  Main: [phone]  Fax: [phone]   Gastroenterology Consultation  Referring Provider:     Kela Millin* Primary Care Physician:  [place] Primary Gastroenterologist:  Dr. Jonathon Bellows  Reason for Consultation:    Abdominal pain, diarrhea , nausea and vomiting         HPI:   Mark Horne is a 21 y.o. y/o male referred to see me for abdominal pain, nausea, vomiting, diarrhea, belching.  Urgent care visit for the  on 05/07/2021.  He expressed that he was having GI symptoms for over 7 months.  Sent home on 20 mg of omeprazole at bedtime and Zofran as needed No recent labs on epic.  No recent labs or Care Everywhere.  He states that for the past several months he has had issues with regurgitation of food first thing in the morning and he brings up food from the prior night.  He has a history of heartburn.  Recently commenced on Prilosec 20 mg once a day which he takes at night before dinnertime which has not helped.  He has been having diarrhea after his meals with stool soft in nature.  Denies any blood in the stool.  Denies any use of artificial sugars or sweeteners.  He has been consuming marijuana.  He does feel much better when he soaks in a hot water bath.  Denies any NSAID use.  He has not lost any weight.  No family history of IBD or colon cancer.  He does have lower abdominal pain cramping in nature nonradiating on a daily basis worse before a bowel movement and slightly better after. No past medical history on file.  Past Surgical History:  Procedure Laterality Date   MOUTH SURGERY      Prior to Admission medications   Medication Sig Start Date End Date Taking? Authorizing Provider  albuterol (VENTOLIN HFA) 108 (90 Base) MCG/ACT inhaler Inhale 1-2 puffs into the lungs every 6 (six) hours as needed for up to 10 days for wheezing or shortness of breath. 12/03/20  12/13/20  Laurene Footman B, PA-C  ibuprofen (ADVIL) 800 MG tablet Take 1 tablet (800 mg total) by mouth every 8 (eight) hours as needed for headache, fever, mild pain or moderate pain. 12/03/20   Danton Clap, PA-C  omeprazole (PRILOSEC) 20 MG capsule Take 1 capsule (20 mg total) by mouth daily. 05/07/21   Margarette Canada, NP  ondansetron (ZOFRAN-ODT) 8 MG disintegrating tablet Take 1 tablet (8 mg total) by mouth every 8 (eight) hours as needed for nausea or vomiting. 05/07/21   Margarette Canada, NP  Probiotic Product (PROBIOTIC-10 PO) Take by mouth.    [provider]    Family History  Problem Relation Age of Onset   Healthy Mother    CAD Father 36       CABG x 4   Hypertension Father    CAD Paternal Grandfather 73       CABG x 2   CAD Paternal Uncle        CABG x 1     Social History   Tobacco Use   Smoking status: Some Days    Packs/day: 1.00    Types: Cigarettes   Smokeless tobacco: Former  Scientific laboratory technician Use: Every day  Substance Use Topics   Alcohol use: Yes    Alcohol/week: 2.0 standard drinks  Types: 2 Cans of beer per week    Comment: rarely   Drug use: Never    Allergies as of 05/27/2021 - Review Complete 05/07/2021  Allergen Reaction Noted   Nickel Rash 06/07/2017    Review of Systems:    All systems reviewed and negative except where noted in HPI.   Physical Exam:  There were no vitals taken for this visit. No LMP for male patient. Psych:  Alert and cooperative. Normal mood and affect. General:   Alert,  Well-developed, well-nourished, pleasant and cooperative in NAD Head:  Normocephalic and atraumatic. Eyes:  Sclera clear, no icterus.   Conjunctiva pink. Ears:  Normal auditory acuity. Lungs:  Respirations even and unlabored.  Clear throughout to auscultation.   No wheezes, crackles, or rhonchi. No acute distress. Heart:  Regular rate and rhythm; no murmurs, clicks, rubs, or gallops. Abdomen:  Normal bowel sounds.  No bruits.  Soft, non-tender  and non-distended without masses, hepatosplenomegaly or hernias noted.  No guarding or rebound tenderness.    Neurologic:  Alert and oriented x3;  grossly normal neurologically. Psych:  Alert and cooperative. Normal mood and affect.  Imaging Studies: No results found.  Assessment and Plan:   Mark Horne is a 21 y.o. y/o male has been referred for nausea vomiting abdominal pain and diarrhea.  History of marijuana use.  History suggestive of acid reflux.  Plan 1.  Avoid all marijuana use 2.  Increase dose of Prilosec to 40 mg twice a day taken before meals. 3.  Stool test, H. pylori breath test, CBC. 4.  EGD and colonoscopy to evaluate for nausea vomiting to rule out gastric outlet obstruction and colonoscopy for diarrhea. 5.  No NSAID use  I have discussed alternative options, risks & benefits,  which include, but are not limited to, bleeding, infection, perforation,respiratory complication & drug reaction.  The patient agrees with this plan & written consent will be obtained.     Follow up in 6 weeks  Dr Jonathon Bellows MD,MRCP(U.K)

## 2021-05-28 LAB — CBC WITH DIFFERENTIAL/PLATELET
Basophils Absolute: 0 10*3/uL (ref 0.0–0.2)
Basos: 1 %
EOS (ABSOLUTE): 0.1 10*3/uL (ref 0.0–0.4)
Eos: 1 %
Hematocrit: 48.6 % (ref 37.5–51.0)
Hemoglobin: 16.9 g/dL (ref 13.0–17.7)
Immature Grans (Abs): 0 10*3/uL (ref 0.0–0.1)
Immature Granulocytes: 0 %
Lymphocytes Absolute: 2.1 10*3/uL (ref 0.7–3.1)
Lymphs: 42 %
MCH: 30.7 pg (ref 26.6–33.0)
MCHC: 34.8 g/dL (ref 31.5–35.7)
MCV: 88 fL (ref 79–97)
Monocytes Absolute: 0.4 10*3/uL (ref 0.1–0.9)
Monocytes: 8 %
Neutrophils Absolute: 2.5 10*3/uL (ref 1.4–7.0)
Neutrophils: 48 %
Platelets: 269 10*3/uL (ref 150–450)
RBC: 5.51 x10E6/uL (ref 4.14–5.80)
RDW: 12.6 % (ref 11.6–15.4)
WBC: 5.1 10*3/uL (ref 3.4–10.8)

## 2021-05-28 LAB — CELIAC DISEASE AB SCREEN W/RFX
Antigliadin Abs, IgA: 4 units (ref 0–19)
IgA/Immunoglobulin A, Serum: 248 mg/dL (ref 90–386)
Transglutaminase IgA: <2 U/mL (ref 0–3)

## 2021-05-28 LAB — H. PYLORI BREATH TEST: H pylori Breath Test: NEGATIVE

## 2021-06-10 ENCOUNTER — Encounter: Payer: Self-pay | Admitting: Registered Nurse

## 2021-06-10 ENCOUNTER — Ambulatory Visit: Admission: RE | Admit: 2021-06-10 | Payer: Self-pay | Source: Home / Self Care | Admitting: Gastroenterology

## 2021-06-10 ENCOUNTER — Encounter: Admission: RE | Payer: Self-pay | Source: Home / Self Care

## 2021-06-10 SURGERY — COLONOSCOPY WITH PROPOFOL
Anesthesia: General

## 2021-07-07 ENCOUNTER — Ambulatory Visit: Payer: Self-pay | Admitting: Gastroenterology

## 2021-07-07 NOTE — Progress Notes (Deleted)
?  ?Mark Mood MD, MRCP(U.K) ?[address]  ?[address]  ?[place], [place] [address]  ?Main: [phone]  ?Fax: [phone] ? ? ?Primary Care Physician: [facility] ? ?Primary Gastroenterologist:  Dr. Wyline Horne  ? ?No chief complaint on file. ? ? ?HPI: Mark Horne is a 21 y.o. male ? ? ?Summary of history : ? ?Mark Horne is a 21 y.o. y/o male initially referred to see me for abdominal pain, nausea, vomiting, diarrhea, belching. ?  ?Urgent care visit for the  on 05/07/2021.  He expressed that he was having GI symptoms for over 7 months.  Sent home on 20 mg of omeprazole at bedtime and Zofran as needed ?No recent labs on epic. For the past several months he has had issues with regurgitation of food first thing in the morning and he brings up food from the prior night.  He has a history of heartburn.  Recently commenced on Prilosec 20 mg once a day which he takes at night before dinnertime which has not helped.  He has been having diarrhea after his meals with stool soft in nature.  Denies any blood in the stool.  Denies any use of artificial sugars or sweeteners.  He has been consuming marijuana.  He does feel much better when he soaks in a hot water bath.  Denies any NSAID use.  He has not lost any weight.  No family history of IBD or colon cancer.  He does have lower abdominal pain cramping in nature nonradiating on a daily basis worse before a bowel movement and slightly better after. ? ?Interval history  05/27/2021-07/07/2021 ? ?***  ? ?Current Outpatient Medications  ?Medication Sig Dispense Refill  ? albuterol (VENTOLIN HFA) 108 (90 Base) MCG/ACT inhaler Inhale 1-2 puffs into the lungs every 6 (six) hours as needed for up to 10 days for wheezing or shortness of breath. 1 g 0  ? ibuprofen (ADVIL) 800 MG tablet Take 1 tablet (800 mg total) by mouth every 8 (eight) hours as needed for headache, fever, mild pain or moderate pain. (Patient not taking: Reported on 05/27/2021) 21 tablet 0  ?  omeprazole (PRILOSEC) 40 MG capsule Take 1 capsule (40 mg total) by mouth 2 (two) times daily. 90 capsule 3  ? ondansetron (ZOFRAN-ODT) 8 MG disintegrating tablet Take 1 tablet (8 mg total) by mouth every 8 (eight) hours as needed for nausea or vomiting. 20 tablet 0  ? Probiotic Product (PROBIOTIC-10 PO) Take by mouth. (Patient not taking: Reported on 05/27/2021)    ? ?No current facility-administered medications for this visit.  ? ? ?Allergies as of 07/07/2021 - Review Complete 05/27/2021  ?Allergen Reaction Noted  ? Nickel Rash 06/07/2017  ? ? ?ROS: ? ?General: Negative for anorexia, weight loss, fever, chills, fatigue, weakness. ?ENT: Negative for hoarseness, difficulty swallowing , nasal congestion. ?CV: Negative for chest pain, angina, palpitations, dyspnea on exertion, peripheral edema.  ?Respiratory: Negative for dyspnea at rest, dyspnea on exertion, cough, sputum, wheezing.  ?GI: See history of present illness. ?GU:  Negative for dysuria, hematuria, urinary incontinence, urinary frequency, nocturnal urination.  ?Endo: Negative for unusual weight change.  ?  ?Physical Examination: ? ? There were no vitals taken for this visit. ? ?General: Well-nourished, well-developed in no acute distress.  ?Eyes: No icterus. Conjunctivae pink. ?Mouth: Oropharyngeal mucosa moist and pink , no lesions erythema or exudate. ?Lungs: Clear to auscultation bilaterally. Non-labored. ?Heart: Regular rate and rhythm, no murmurs rubs or gallops.  ?Abdomen: Bowel  sounds are normal, nontender, nondistended, no hepatosplenomegaly or masses, no abdominal bruits or hernia , no rebound or guarding.   ?Extremities: No lower extremity edema. No clubbing or deformities. ?Neuro: Alert and oriented x 3.  Grossly intact. ?Skin: Warm and dry, no jaundice.   ?Psych: Alert and cooperative, normal Horne and affect. ? ? ?Imaging Studies: ?No results found. ? ?Assessment and Plan:  ? ?Mark Horne is a 21 y.o. y/o male here to follow up for nausea  vomiting abdominal pain and diarrhea.  History of marijuana use.  History suggestive of acid reflux. ?  ?Plan ?1.  Avoid all marijuana use ?2.  Increase dose of Prilosec to 40 mg twice a day taken before meals. ?3.  Stool test, H. pylori breath test, CBC. ?4.  EGD and colonoscopy to evaluate for nausea vomiting to rule out gastric outlet obstruction and colonoscopy for diarrhea. ?5.  No NSAID use ? ?Dr Mark Mood  MD,MRCP [facility]) ?Follow up in ***  ?

## 2021-10-24 IMAGING — CR DG CHEST 2V
2 series · 2 of 2 positions shown · non-contrast
Comparison: Chest x-ray report 03/24/2017.

CLINICAL DATA: Shortness of breath.  Tachycardia.  Body aches.

EXAM:
CHEST - 2 VIEW

[chest pa]
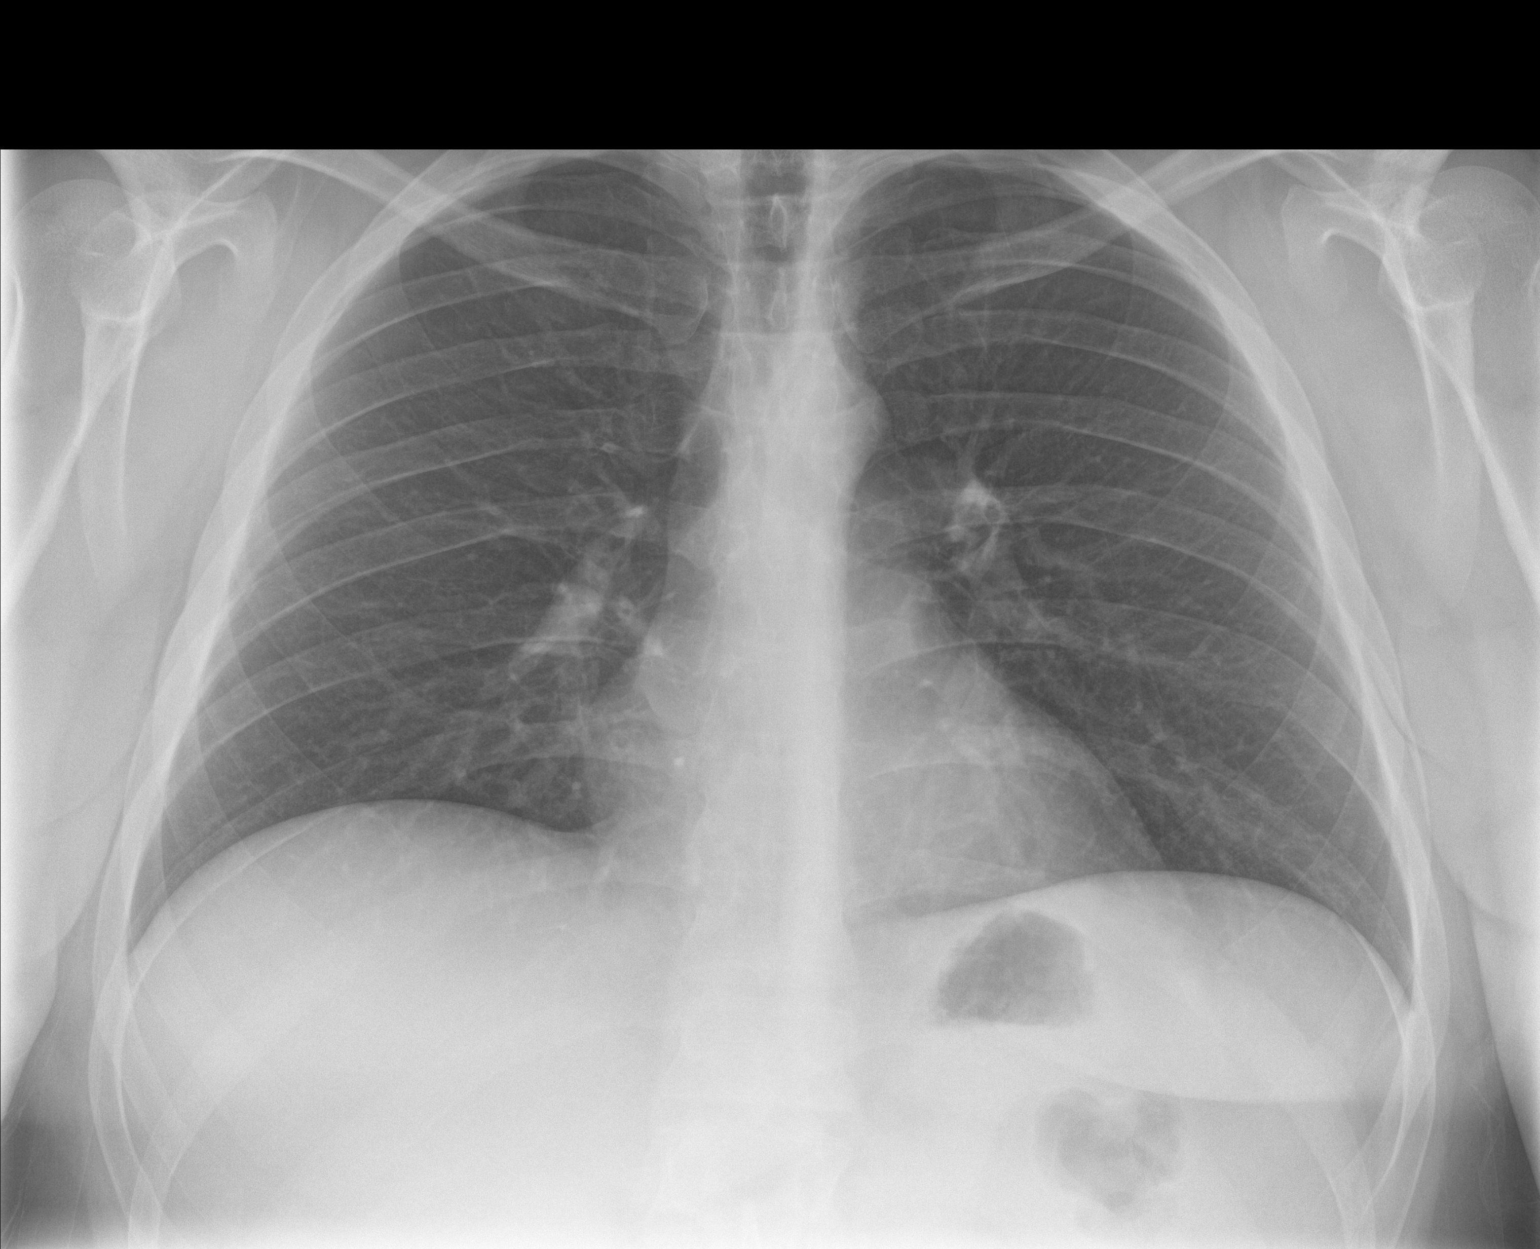

[chest lat]
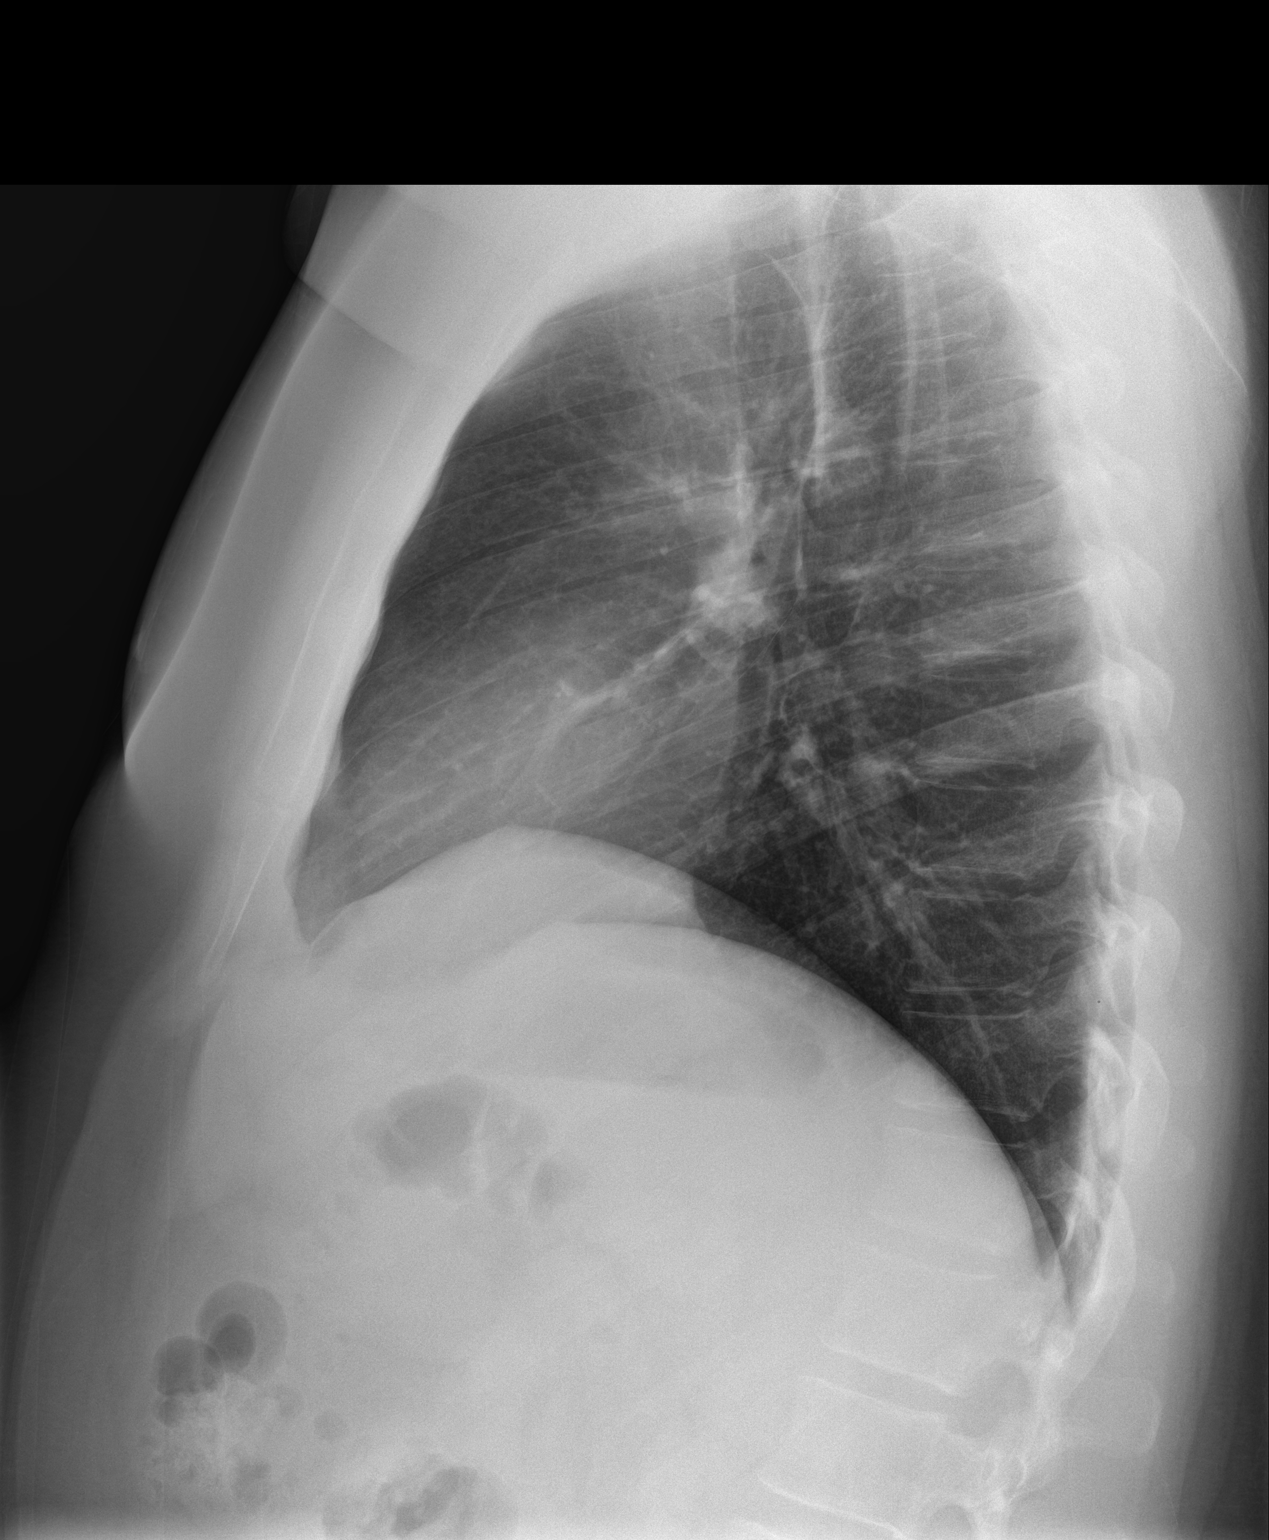

[2 of 2 positions shown; findings below may reference images not displayed]

FINDINGS: Mediastinum and hilar structures normal. Heart size normal. Low lung
volumes. Mild posterior basilar infiltrate cannot be excluded on
lateral view. No pleural effusion or pneumothorax. Degenerative
change thoracic spine.
IMPRESSION: Low lung volumes. Mild posterior basilar infiltrate cannot be
excluded on lateral view.

## 2022-01-28 ENCOUNTER — Ambulatory Visit
Admission: EM | Admit: 2022-01-28 | Discharge: 2022-01-28 | Disposition: A | Discharge status: Home / Self Care | Payer: Self-pay | Attending: Family | Admitting: Family

## 2022-01-28 DIAGNOSIS — S0340XA Sprain of jaw, unspecified side, initial encounter: Secondary | ICD-10-CM

## 2022-01-28 DIAGNOSIS — R6884 Jaw pain: Secondary | ICD-10-CM

## 2022-01-28 MED ORDER — CYCLOBENZAPRINE HCL 10 MG PO TABS: ORAL_TABLET | ORAL | 0 refills | Status: DC

## 2022-01-28 MED ORDER — NAPROXEN 500 MG PO TABS: 500.0000 mg | ORAL_TABLET | Freq: Two times a day (BID) | ORAL | 0 refills | Status: DC | PRN

## 2022-01-28 NOTE — Discharge Instructions (Addendum)
Recommend start Naproxen 500mg  every 12 hours as needed for pain and inflammation. Recommend start Flexeril muscle relaxer 10mg - take 1/2 to 1 whole tablet by mouth every 8 hours as needed- may cause drowsiness. May apply warm compresses to area for comfort. Recommend follow-up in 3 days for recheck and imaging if pain and symptoms do not improve.

## 2022-01-28 NOTE — ED Provider Notes (Signed)
[facility]    CSN: [id number] Arrival date & time: 01/28/22  1449      History   Chief Complaint Chief Complaint  Patient presents with   Jaw Pain    Right     HPI Mark Horne is a 21 y.o. male.   HPI  History reviewed. No pertinent past medical history.  Patient Active Problem List   Diagnosis Date Noted   Biceps tendinitis of right shoulder 09/03/2015    Past Surgical History:  Procedure Laterality Date   MOUTH SURGERY         Home Medications    Prior to Admission medications   Medication Sig Start Date End Date Taking? Authorizing Provider  cyclobenzaprine (FLEXERIL) 10 MG tablet Take 1/2 to 1 whole tablet by mouth every 8 hours as needed for muscle pain/spasms 01/28/22  Yes Joshaua Epple, Nicholes Stairs, NP  naproxen (NAPROSYN) 500 MG tablet Take 1 tablet (500 mg total) by mouth every 12 (twelve) hours as needed for moderate pain. 01/28/22  Yes Kamir Selover, Nicholes Stairs, NP  albuterol (VENTOLIN HFA) 108 (90 Base) MCG/ACT inhaler Inhale 1-2 puffs into the lungs every 6 (six) hours as needed for up to 10 days for wheezing or shortness of breath. 12/03/20 05/27/21  Danton Clap, PA-C  omeprazole (PRILOSEC) 40 MG capsule Take 1 capsule (40 mg total) by mouth 2 (two) times daily. 05/27/21   Jonathon Bellows, MD  Probiotic Product (PROBIOTIC-10 PO) Take by mouth. Patient not taking: Reported on 05/27/2021    [provider]    Family History Family History  Problem Relation Age of Onset   Healthy Mother    CAD Father 7       CABG x 4   Hypertension Father    CAD Paternal Grandfather 14       CABG x 2   CAD Paternal Uncle        CABG x 1    Social History Social History   Tobacco Use   Smoking status: Some Days    Packs/day: 1.00    Types: Cigarettes   Smokeless tobacco: Former  Scientific laboratory technician Use: Every day  Substance Use Topics   Alcohol use: Yes    Alcohol/week: 2.0 standard drinks of alcohol    Types: 2 Cans of beer per week    Comment:  rarely   Drug use: Never     Allergies   Nickel   Review of Systems Review of Systems   Physical Exam Triage Vital Signs ED Triage Vitals  Enc Vitals Group     BP 01/28/22 1542 121/80     Pulse Rate 01/28/22 1542 72     Resp --      Temp 01/28/22 1542 98.1 F (36.7 C)     Temp Source 01/28/22 1542 Oral     SpO2 01/28/22 1542 100 %     Weight 01/28/22 1541 220 lb (99.8 kg)     Height 01/28/22 1541 5\' 10"  (1.778 m)     Head Circumference --      Peak Flow --      Pain Score 01/28/22 1541 5     Pain Loc --      Pain Edu? --      Excl. in [place]? --    No data found.  Updated Vital Signs BP 121/80 (BP Location: Left Arm)   Pulse 72   Temp 98.1 F (36.7 C) (Oral)   Ht 5\' 10"  (1.778  m)   Wt 220 lb (99.8 kg)   SpO2 100%   BMI 31.57 kg/m   Visual Acuity Right Eye Distance:   Left Eye Distance:   Bilateral Distance:    Right Eye Near:   Left Eye Near:    Bilateral Near:     Physical Exam HENT:     Head:       UC Treatments / Results  Labs (all labs ordered are listed, but only abnormal results are displayed) Labs Reviewed - No data to display  EKG   Radiology No results found.  Procedures Procedures (including critical care time)  Medications Ordered in UC Medications - No data to display  Initial Impression / Assessment and Plan / UC Course  I have reviewed the triage vital signs and the nursing notes.  Pertinent labs & imaging results that were available during my care of the patient were reviewed by me and considered in my medical decision making (see chart for details).     *** Final Clinical Impressions(s) / UC Diagnoses   Final diagnoses:  TMJ (sprain of temporomandibular joint), initial encounter  Pain in bone of jaw     Discharge Instructions      Recommend start Naproxen 500mg  every 12 hours as needed for pain and inflammation. Recommend start Flexeril muscle relaxer 10mg - take 1/2 to 1 whole tablet by mouth every 8 hours  as needed- may cause drowsiness. May apply warm compresses to area for comfort. Recommend follow-up in 3 days for recheck and imaging if pain and symptoms do not improve.    ED Prescriptions     Medication Sig Dispense Auth. Provider   naproxen (NAPROSYN) 500 MG tablet Take 1 tablet (500 mg total) by mouth every 12 (twelve) hours as needed for moderate pain. 20 tablet , NP   cyclobenzaprine (FLEXERIL) 10 MG tablet Take 1/2 to 1 whole tablet by mouth every 8 hours as needed for muscle pain/spasms 15 tablet Willet Schleifer, , NP      PDMP not reviewed this encounter.

## 2022-01-28 NOTE — ED Triage Notes (Signed)
Patient reports that 2 days ago he slept on the couch. Patient reports right jaw pain. Patient reports that he thinks its " out of line." And would like an xray,.   Patient reports that it hurts to talk and he has not eaten in about 2 days because it hurts to chew.

## 2023-01-05 ENCOUNTER — Ambulatory Visit: Payer: Medicaid Other

## 2023-01-05 ENCOUNTER — Ambulatory Visit
Admission: EM | Admit: 2023-01-05 | Discharge: 2023-01-05 | Discharge status: Against Medical Advice | Payer: Medicaid Other

## 2023-01-05 DIAGNOSIS — M533 Sacrococcygeal disorders, not elsewhere classified: Secondary | ICD-10-CM | POA: Diagnosis not present

## 2023-01-05 NOTE — ED Triage Notes (Signed)
Pt c/o tailbone pain x5 days. States he bent over & heard a pop which caused a shooting pain which lasted about 15-20 secs.

## 2023-01-05 NOTE — ED Provider Notes (Signed)
[facility]    CSN: [id number] Arrival date & time: 01/05/23  1449      History   Chief Complaint Chief Complaint  Patient presents with   Tailbone Pain    HPI Mark Horne is a 22 y.o. male.   HPI  22 year old male with no significant past medical history presents for evaluation of 5 days worth of tailbone pain.  He reports that a week ago he bent over at the waist felt and heard a pop followed by shooting pain of his spine that lasted 15 to 20 seconds.  Few days later he started to develop increased soreness at the site of his tailbone and he is also started to develop numbness down the backs of both of his legs if he sits for long peers of time.  The numbness resolves when he stands up.  He denies any saddle anesthesia or loss of bowel or bladder control.,.  History reviewed. No pertinent past medical history.  Patient Active Problem List   Diagnosis Date Noted   Biceps tendinitis of right shoulder 09/03/2015    Past Surgical History:  Procedure Laterality Date   MOUTH SURGERY         Home Medications    Prior to Admission medications   Medication Sig Start Date End Date Taking? Authorizing Provider  albuterol (VENTOLIN HFA) 108 (90 Base) MCG/ACT inhaler Inhale 1-2 puffs into the lungs every 6 (six) hours as needed for up to 10 days for wheezing or shortness of breath. 12/03/20 05/27/21  Eusebio Friendly B, PA-C  cyclobenzaprine (FLEXERIL) 10 MG tablet Take 1/2 to 1 whole tablet by mouth every 8 hours as needed for muscle pain/spasms 01/28/22   Sudie Grumbling, NP  naproxen (NAPROSYN) 500 MG tablet Take 1 tablet (500 mg total) by mouth every 12 (twelve) hours as needed for moderate pain. 01/28/22   Sudie Grumbling, NP  omeprazole (PRILOSEC) 40 MG capsule Take 1 capsule (40 mg total) by mouth 2 (two) times daily. 05/27/21   Wyline Mood, MD    Family History Family History  Problem Relation Age of Onset   Healthy Mother    CAD Father 50       CABG x 4    Hypertension Father    CAD Paternal Grandfather 29       CABG x 2   CAD Paternal Uncle        CABG x 1    Social History Social History   Tobacco Use   Smoking status: Some Days    Current packs/day: 1.00    Types: Cigarettes   Smokeless tobacco: Former  Building services engineer status: Every Day  Substance Use Topics   Alcohol use: Yes    Alcohol/week: 2.0 standard drinks of alcohol    Types: 2 Cans of beer per week    Comment: rarely   Drug use: Never     Allergies   Nickel   Review of Systems Review of Systems  Gastrointestinal:        No loss of bowel or bladder control  Musculoskeletal:  Positive for back pain.  Neurological:  Negative for weakness and numbness.     Physical Exam Triage Vital Signs ED Triage Vitals  Encounter Vitals Group     BP --      Systolic BP Percentile --      Diastolic BP Percentile --      Pulse --      Resp 01/05/23  1513 16     Temp --      Temp Source 01/05/23 1513 Oral     SpO2 --      Weight 01/05/23 1513 220 lb (99.8 kg)     Height --      Head Circumference --      Peak Flow --      Pain Score 01/05/23 1517 6     Pain Loc --      Pain Education --      Exclude from Growth Chart --    No data found.  Updated Vital Signs BP 123/79 (BP Location: Left Arm)   Pulse 80   Temp 98.9 F (37.2 C) (Oral)   Resp 16   Wt 220 lb (99.8 kg)   SpO2 99%   BMI 31.57 kg/m   Visual Acuity Right Eye Distance:   Left Eye Distance:   Bilateral Distance:    Right Eye Near:   Left Eye Near:    Bilateral Near:     Physical Exam Vitals and nursing note reviewed.  Constitutional:      Appearance: Normal appearance. He is not ill-appearing.  HENT:     Head: Normocephalic and atraumatic.  Musculoskeletal:        General: Tenderness present. No swelling or deformity.  Skin:    General: Skin is warm and dry.     Capillary Refill: Capillary refill takes less than 2 seconds.     Findings: No bruising or erythema.   Neurological:     General: No focal deficit present.     Mental Status: He is alert and oriented to person, place, and time.      UC Treatments / Results  Labs (all labs ordered are listed, but only abnormal results are displayed) Labs Reviewed - No data to display  EKG   Radiology No results found.  Procedures Procedures (including critical care time)  Medications Ordered in UC Medications - No data to display  Initial Impression / Assessment and Plan / UC Course  I have reviewed the triage vital signs and the nursing notes.  Pertinent labs & imaging results that were available during my care of the patient were reviewed by me and considered in my medical decision making (see chart for details).   Patient is a nontoxic-appearing 22 year old male presenting for evaluation of 5 days worth of tailbone pain as outlined HPI above.  On exam patient has tenderness with palpation at the apex of the gluteal cleft but there is no ecchymosis, erythema, or induration.  No indications to suggest possible developing pilonidal cyst.  He has had this recurrent issue for a number of years but this has lasted longer than his usual.  No known trauma.  I will obtain radiographs of the patient's sacrum and coccyx to rule out any bony abnormality.  If his x-rays are negative I will refer him to the spine clinic for further evaluation.  Patient left prior to receiving his x-ray is without explanation.   Final Clinical Impressions(s) / UC Diagnoses   Final diagnoses:  Coccyx pain   Discharge Instructions   None    ED Prescriptions   None    PDMP not reviewed this encounter.   Becky Augusta, NP 01/05/23 1601

## 2023-03-17 ENCOUNTER — Other Ambulatory Visit: Payer: Self-pay

## 2023-03-17 ENCOUNTER — Ambulatory Visit
Admission: EM | Admit: 2023-03-17 | Discharge: 2023-03-17 | Payer: Medicaid Other | Attending: Family Medicine | Admitting: Family Medicine

## 2023-03-17 ENCOUNTER — Emergency Department
Admission: EM | Admit: 2023-03-17 | Discharge: 2023-03-17 | Disposition: A | Discharge status: Home / Self Care | Payer: Medicaid Other | Attending: Emergency Medicine | Admitting: Emergency Medicine

## 2023-03-17 ENCOUNTER — Ambulatory Visit: Payer: Medicaid Other

## 2023-03-17 ENCOUNTER — Emergency Department: Payer: Medicaid Other

## 2023-03-17 DIAGNOSIS — R1030 Lower abdominal pain, unspecified: Secondary | ICD-10-CM | POA: Diagnosis not present

## 2023-03-17 DIAGNOSIS — R1031 Right lower quadrant pain: Secondary | ICD-10-CM | POA: Diagnosis present

## 2023-03-17 DIAGNOSIS — R10813 Right lower quadrant abdominal tenderness: Secondary | ICD-10-CM

## 2023-03-17 DIAGNOSIS — R1032 Left lower quadrant pain: Secondary | ICD-10-CM | POA: Insufficient documentation

## 2023-03-17 DIAGNOSIS — M7989 Other specified soft tissue disorders: Secondary | ICD-10-CM

## 2023-03-17 LAB — URINALYSIS, ROUTINE W REFLEX MICROSCOPIC
Bilirubin Urine: NEGATIVE
Glucose, UA: NEGATIVE mg/dL
Hgb urine dipstick: NEGATIVE
Ketones, ur: NEGATIVE mg/dL
Leukocytes,Ua: NEGATIVE
Nitrite: NEGATIVE
Protein, ur: NEGATIVE mg/dL
Specific Gravity, Urine: 1.028 (ref 1.005–1.030)
pH: 5 (ref 5.0–8.0)

## 2023-03-17 LAB — COMPREHENSIVE METABOLIC PANEL
ALT: 43 U/L (ref 0–44)
AST: 19 U/L (ref 15–41)
Albumin: 4.4 g/dL (ref 3.5–5.0)
Alkaline Phosphatase: 57 U/L (ref 38–126)
Anion gap: 10 (ref 5–15)
BUN: 12 mg/dL (ref 6–20)
CO2: 24 mmol/L (ref 22–32)
Calcium: 9.2 mg/dL (ref 8.9–10.3)
Chloride: 106 mmol/L (ref 98–111)
Creatinine, Ser: 0.84 mg/dL (ref 0.61–1.24)
GFR, Estimated: >60 mL/min (ref >60)
Glucose, Bld: 90 mg/dL (ref 70–99)
Potassium: 4.1 mmol/L (ref 3.5–5.1)
Sodium: 140 mmol/L (ref 135–145)
Total Bilirubin: 0.6 mg/dL (ref <1.2)
Total Protein: 7.1 g/dL (ref 6.5–8.1)

## 2023-03-17 LAB — CBC
HCT: 50.6 % (ref 39.0–52.0)
Hemoglobin: 17.4 g/dL — ABNORMAL HIGH (ref 13.0–17.0)
MCH: 30.6 pg (ref 26.0–34.0)
MCHC: 34.4 g/dL (ref 30.0–36.0)
MCV: 88.9 fL (ref 80.0–100.0)
Platelets: 273 10*3/uL (ref 150–400)
RBC: 5.69 MIL/uL (ref 4.22–5.81)
RDW: 11.9 % (ref 11.5–15.5)
WBC: 7.8 10*3/uL (ref 4.0–10.5)
nRBC: 0 % (ref 0.0–0.2)

## 2023-03-17 LAB — LIPASE, BLOOD: Lipase: 27 U/L (ref 11–51)

## 2023-03-17 MED ORDER — IOHEXOL 300 MG/ML  SOLN
100.0000 mL | Freq: Once | INTRAMUSCULAR | Status: AC | PRN
  Administered 2023-03-17: 100 mL via INTRAVENOUS

## 2023-03-17 NOTE — ED Provider Notes (Signed)
[facility] Provider Note    Event Date/Time   First MD Initiated Contact with Patient 03/17/23 1223     (approximate)   History   Abdominal Pain   HPI  Mark Horne is a 22 y.o. male history of chronic intermittent abdominal constipation  For a few days now has been experiencing a feeling of constipation since the end of last week.  He tried magnesium citrate enemas at home.  Despite this continue to feel constipated and occasional sharp feeling in his lower abdomen more in the right.  No nausea or vomiting.  No fevers or chills.  No pain or burning with urination.  He has not noticed any masses swelling in the scrotum or the groin.  He went to urgent care today, they evaluated him and recommended that he come for a CT scan  He was able to pop past what he describes about a handful of fairly soft stool yesterday after staying home from work and using an enema.  Reports some mild discomfort in the lower abdomen     Physical Exam   Triage Vital Signs: ED Triage Vitals  Encounter Vitals Group     BP 03/17/23 1212 (!) 145/84     Systolic BP Percentile --      Diastolic BP Percentile --      Pulse Rate 03/17/23 1212 79     Resp 03/17/23 1212 18     Temp 03/17/23 1212 98.2 F (36.8 C)     Temp Source 03/17/23 1212 Oral     SpO2 03/17/23 1212 99 %     Weight 03/17/23 1210 220 lb 10.9 oz (100.1 kg)     Height 03/17/23 1210 5\' 11"  (1.803 m)     Head Circumference --      Peak Flow --      Pain Score --      Pain Loc --      Pain Education --      Exclude from Growth Chart --     Most recent vital signs: Vitals:   03/17/23 1212  BP: (!) 145/84  Pulse: 79  Resp: 18  Temp: 98.2 F (36.8 C)  SpO2: 99%     General: Awake, no distress.  Pleasant.  Ambulatory without difficulty CV:  Good peripheral perfusion.  Resp:  Normal effort.  Abd:  No distention.  Abdomen is soft, reports mild tenderness without rebound or guarding in the lower  quadrants bilaterally.  Some pain at McBurney's point but also fairly equivocal in the left lower quadrant.  He reports he went really noticed the pain except for when it is being pushed on.  No groin pain or masses.  No obvious hernia Other:     ED Results / Procedures / Treatments   Labs (all labs ordered are listed, but only abnormal results are displayed) Labs Reviewed  CBC - Abnormal; Notable for the following components:      Result Value   Hemoglobin 17.4 (*)    All other components within normal limits  URINALYSIS, ROUTINE W REFLEX MICROSCOPIC - Abnormal; Notable for the following components:   Color, Urine YELLOW (*)    APPearance CLEAR (*)    All other components within normal limits  COMPREHENSIVE METABOLIC PANEL  LIPASE, BLOOD     EKG     RADIOLOGY  CT abdomen pelvis pending at time of signout to Dr. Derrill Kay   PROCEDURES:  Critical Care performed: No  Procedures  MEDICATIONS ORDERED IN ED: Medications  iohexol (OMNIPAQUE) 300 MG/ML solution 100 mL (100 mLs Intravenous Contrast Given 03/17/23 1447)     IMPRESSION / MDM / ASSESSMENT AND PLAN / ED COURSE  I reviewed the triage vital signs and the nursing notes.                              Differential diagnosis includes, but is not limited to, possible bowel spasm, constipation, colitis, or other small bowel related inflammatory process.  The clinical history seems most suggestive of constipation.  He is awake alert well-appearing.  Afebrile.  His lower abdominal pain, and does indeed have pain in the right lower quadrant but it does not seem to be focally there and I suspect overall unlikely to represent early appendicitis diverticulitis obstructive pathology.  No signs or symptoms would be suggestive of vascular causation hernia etc.  Will obtain CT to evaluate further, patient understand agreeable, which to exclude early appendicitis or other intraabdominal acute medical condition.  He does not have  any symptoms of obstruction and is passing his bowels albeit does sound as though he is still somewhat constipated  Patient's presentation is most consistent with acute complicated illness / injury requiring diagnostic workup.   ----------------------------------------- 3:31 PM on 03/17/2023 ----------------------------------------- Ongoing care assigned to Dr. Derrill Kay.  Await review of CT abdomen pelvis.  If reassuring without acute or concerning finding would anticipate the patient be able to discharge to follow-up with PCP with return precautions.       FINAL CLINICAL IMPRESSION(S) / ED DIAGNOSES   Final diagnoses:  Lower abdominal pain     Rx / DC Orders   ED Discharge Orders     None        Note:  This document was prepared using Dragon voice recognition software and may include unintentional dictation errors.   Sharyn Creamer, MD 03/17/23 [phone]

## 2023-03-17 NOTE — ED Notes (Signed)
Patient is being discharged from the Urgent Care and sent to the Emergency Department via [facility] . Per Dr Rachael Darby, patient is in need of higher level of care due to RLQ pain. Patient is aware and verbalizes understanding of plan of care.  Vitals:   03/17/23 1003  BP: 131/78  Pulse: 73  Resp: 16  Temp: 98.6 F (37 C)  SpO2: 95%

## 2023-03-17 NOTE — ED Triage Notes (Signed)
Sent to ED from [facility] for concerns of RLQ abd pain.  Patient arrives AAOx3.  Skin warm and dry. NAD.

## 2023-03-17 NOTE — ED Triage Notes (Addendum)
Pt c/o lower abd pain & constipation x5 days. Had N/V/D 1 wk prior which subsided. Is able to keep food & fluids down. [id number]. Has tried laxatives & enemas w/o relief. States he recently changed to a healthier diet.

## 2023-03-17 NOTE — Discharge Instructions (Signed)

## 2023-03-17 NOTE — ED Provider Notes (Signed)
[facility]    CSN: [id number] Arrival date & time: 03/17/23  5366      History   Chief Complaint Chief Complaint  Patient presents with   Abdominal Pain   Constipation    HPI NITHILAN FREEMON is a 22 y.o. male.   HPI  Cashus presents for lower abdominal pain since Saturday morning.  Has had small pellet like stools. Has 3 pellet like stools yesterday.  Has pain worse at night after eating dinner. Vomited on Tuesday after eating some food.   Drank 3-32 oz water amd 2-32 oz of water with liquid IV oz, drank Magnesium citrate 10 oz without relief. He performed a saline enema and repeated the  saline enema the next day.   No known family history of colon cancer.  Has had intestinal issues for 2-3 years. Has nausea and vomiting at times especially after waking up that can occur 2-3 times a week He has changed his diet but this has gotten better.  At times he had to go to bathroom immediately after eating but denies diarrhea. He had seen a gastroenterologist in the past but was not able to avoid to pay for colonoscopy and endoscopy as he didn't have insurance.       History reviewed. No pertinent past medical history.  Patient Active Problem List   Diagnosis Date Noted   Biceps tendinitis of right shoulder 09/03/2015    Past Surgical History:  Procedure Laterality Date   MOUTH SURGERY         Home Medications    Prior to Admission medications   Medication Sig Start Date End Date Taking? Authorizing Provider  diclofenac (VOLTAREN) 75 MG EC tablet Take 75 mg by mouth 2 (two) times daily. 01/21/23  Yes [provider]  omeprazole (PRILOSEC) 40 MG capsule Take 1 capsule (40 mg total) by mouth 2 (two) times daily. 05/27/21  Yes Wyline Mood, MD  albuterol (VENTOLIN HFA) 108 (90 Base) MCG/ACT inhaler Inhale 1-2 puffs into the lungs every 6 (six) hours as needed for up to 10 days for wheezing or shortness of breath. 12/03/20 05/27/21  Eusebio Friendly B, PA-C   cyclobenzaprine (FLEXERIL) 10 MG tablet Take 1/2 to 1 whole tablet by mouth every 8 hours as needed for muscle pain/spasms 01/28/22   Sudie Grumbling, NP  naproxen (NAPROSYN) 500 MG tablet Take 1 tablet (500 mg total) by mouth every 12 (twelve) hours as needed for moderate pain. 01/28/22   Sudie Grumbling, NP    Family History Family History  Problem Relation Age of Onset   Healthy Mother    CAD Father 2       CABG x 4   Hypertension Father    CAD Paternal Grandfather 71       CABG x 2   CAD Paternal Uncle        CABG x 1    Social History Social History   Tobacco Use   Smoking status: Some Days    Current packs/day: 1.00    Types: Cigarettes   Smokeless tobacco: Former  Building services engineer status: Every Day  Substance Use Topics   Alcohol use: Yes    Alcohol/week: 2.0 standard drinks of alcohol    Types: 2 Cans of beer per week    Comment: rarely   Drug use: Never     Allergies   Diclofenac and Nickel   Review of Systems Review of Systems :negative unless otherwise stated in  HPI.      Physical Exam Triage Vital Signs ED Triage Vitals  Encounter Vitals Group     BP 03/17/23 1003 131/78     Systolic BP Percentile --      Diastolic BP Percentile --      Pulse Rate 03/17/23 1003 73     Resp 03/17/23 1003 16     Temp 03/17/23 1003 98.6 F (37 C)     Temp Source 03/17/23 1003 Oral     SpO2 03/17/23 1003 95 %     Weight 03/17/23 1003 220 lb 12.8 oz (100.2 kg)     Height 03/17/23 1003 5\' 11"  (1.803 m)     Head Circumference --      Peak Flow --      Pain Score 03/17/23 1008 2     Pain Loc --      Pain Education --      Exclude from Growth Chart --    No data found.  Updated Vital Signs BP 131/78 (BP Location: Right Arm)   Pulse 73   Temp 98.6 F (37 C) (Oral)   Resp 16   Ht 5\' 11"  (1.803 m)   Wt 100.2 kg   SpO2 95%   BMI 30.80 kg/m   Visual Acuity Right Eye Distance:   Left Eye Distance:   Bilateral Distance:    Right Eye Near:   Left  Eye Near:    Bilateral Near:     Physical Exam  GEN: pleasant well appearing male, in no acute distress  RESP: no increased work of breathing ABD: Bowel sounds present. Soft, RLQ > LLQ tenderness, non-distended. No guarding, no rebound, no appreciable hepatosplenomegaly, no CVA tenderness, positive McBurney's, negative Murphy MSK: no extremity edema SKIN: warm, dry, no rash on visible skin   UC Treatments / Results  Labs (all labs ordered are listed, but only abnormal results are displayed) Labs Reviewed - No data to display  EKG  If EKG performed, see my interpretation and MDM section  Radiology No results found.   Procedures Procedures (including critical care time)  Medications Ordered in UC Medications - No data to display  Initial Impression / Assessment and Plan / UC Course  I have reviewed the triage vital signs and the nursing notes.  Pertinent labs & imaging results that were available during my care of the patient were reviewed by me and considered in my medical decision making (see chart for details).       Patient is a  22 y.o. malewith history constipation who presents after having insidious lower abdominal pain with constipation for the past 5 days.  Has had intermittent vomiting and nausea  some of which has occurred for years.  Seen by [facility] in the past.  Per Dr. Johnney Killian note on 05/27/2021, it was recommended he have an EGD and colonoscopy to evaluate why he is having nausea and vomiting to rule out gastric outlet obstruction and the colonoscopy for his chronic diarrhea.  He was advised to avoid marijuana.  He denies diarrhea now after changing his diet.  He have underlying IBS.   Overall, patient is well-appearing, well-hydrated, and in no acute distress.  Vital signs stable.  Jesseis afebrile.  He has RLQ more than LLQ abdominal tenderness with palpation.  Obtained KUB to assess for stool burden which he has on the right. Given his abdominal tenderness  discussed ED evaluation to rule out appendicitis and evaluate for possible diverticulitis.  Patient has not  had any advanced abdominal imaging, per chart.       Follow-up, return and ED precautions given.  Discussed MDM, treatment plan and plan for follow-up with patient who agrees with plan.    Final Clinical Impressions(s) / UC Diagnoses   Final diagnoses:  Right lower quadrant abdominal tenderness without rebound tenderness  Lower abdominal pain     Discharge Instructions      You have been advised to follow up immediately in the emergency department for concerning signs or symptoms as discussed during your visit. If you declined EMS transport, please have a family member take you directly to the ED at this time. Do not delay.   Based on concerns about condition, if you do not follow up in the ED, you may risk poor outcomes including worsening of condition, delayed treatment and potentially life threatening issues. If you have declined to go to the ED at this time, you should call your PCP immediately to set up a follow up appointment.       ED Prescriptions   None    PDMP not reviewed this encounter.   Katha Cabal, DO 03/17/23 1151

## 2023-03-18 ENCOUNTER — Telehealth: Payer: Self-pay

## 2023-03-18 NOTE — Telephone Encounter (Signed)
Okay I will call him to schedule

## 2023-03-18 NOTE — Telephone Encounter (Signed)
Patient is calling wanting to schedule his colonoscopy since he's been to the Urgent care and ED yesterday. Patient was told to reach out to [place] so he is calling to schedule his colonoscopy that was to be done last year after his office visit with you on 05/27/2021 and he called to cancel his procedure in June 30, 2021. Please advise if we schedule him an office visit or colonoscopy? Thank you.

## 2023-03-18 NOTE — Telephone Encounter (Signed)
Pt requesting call back to schedule colonoscopy.

## 2023-03-19 NOTE — Telephone Encounter (Signed)
Appointment scheduled for 04/26/2023 with Dr. Tobi Bastos.

## 2023-04-26 ENCOUNTER — Ambulatory Visit: Payer: Medicaid Other | Admitting: Gastroenterology

## 2023-04-26 NOTE — Progress Notes (Deleted)
Wyline Mood MD, MRCP(U.K) [address]  [address]  [place], [place] [address]  Main: [phone]  Fax: [phone]   Primary Care Physician: Pcp, No  Primary Gastroenterologist:  Dr. Wyline Mood   No chief complaint on file.   HPI: Mark Horne is a 22 y.o. male   Summary of history :  Initially referred and seen back in 05/2021 for  for abdominal pain, nausea, vomiting, diarrhea, belching.   Urgent care visit for the  on 05/07/2021.  He expressed that he was having GI symptoms for over 7 months.  Sent home on 20 mg of omeprazole at bedtime and Zofran as needed.No recent labs on epic.  No recent labs or Care Everywhere.   He had stated  that for the past several months he has had issues with regurgitation of food first thing in the morning and he brings up food from the prior night.  He has a history of heartburn.  Had commenced on Prilosec 20 mg once a day which he took  at night before dinnertime which had not helped.  He had been having diarrhea after his meals with stool soft in nature.  Denied any blood in the stool, any use of artificial sugars or sweeteners.  He had been consuming marijuana.  He does feel much better when he soaks in a hot water bath.  Denies any NSAID use.  He has not lost any weight.  No family history of IBD or colon cancer.  He does have lower abdominal pain cramping in nature nonradiating on a daily basis worse before a bowel movement and slightly better after.   Interval history 05/27/2021-04/26/2023    ***   Current Outpatient Medications  Medication Sig Dispense Refill   albuterol (VENTOLIN HFA) 108 (90 Base) MCG/ACT inhaler Inhale 1-2 puffs into the lungs every 6 (six) hours as needed for up to 10 days for wheezing or shortness of breath. 1 g 0   cyclobenzaprine (FLEXERIL) 10 MG tablet Take 1/2 to 1 whole tablet by mouth every 8 hours as needed for muscle pain/spasms 15 tablet 0   diclofenac (VOLTAREN) 75 MG EC tablet Take 75 mg by mouth 2  (two) times daily.     naproxen (NAPROSYN) 500 MG tablet Take 1 tablet (500 mg total) by mouth every 12 (twelve) hours as needed for moderate pain. 20 tablet 0   omeprazole (PRILOSEC) 40 MG capsule Take 1 capsule (40 mg total) by mouth 2 (two) times daily. 90 capsule 3   No current facility-administered medications for this visit.    Allergies as of 04/26/2023 - Review Complete 03/17/2023  Allergen Reaction Noted   Diclofenac Rash 03/17/2023   Nickel Rash 06/07/2017       Interval history   ***/***/202*   ***/***/2024   ROS:  General: Negative for anorexia, weight loss, fever, chills, fatigue, weakness. ENT: Negative for hoarseness, difficulty swallowing , nasal congestion. CV: Negative for chest pain, angina, palpitations, dyspnea on exertion, peripheral edema.  Respiratory: Negative for dyspnea at rest, dyspnea on exertion, cough, sputum, wheezing.  GI: See history of present illness. GU:  Negative for dysuria, hematuria, urinary incontinence, urinary frequency, nocturnal urination.  Endo: Negative for unusual weight change.    Physical Examination:   There were no vitals taken for this visit.  General: Well-nourished, well-developed in no acute distress.  Eyes: No icterus. Conjunctivae pink. Mouth: Oropharyngeal mucosa moist and pink , no lesions erythema or exudate. Lungs: Clear to auscultation bilaterally.  Non-labored. Heart: Regular rate and rhythm, no murmurs rubs or gallops.  Abdomen: Bowel sounds are normal, nontender, nondistended, no hepatosplenomegaly or masses, no abdominal bruits or hernia , no rebound or guarding.   Extremities: No lower extremity edema. No clubbing or deformities. Neuro: Alert and oriented x 3.  Grossly intact. Skin: Warm and dry, no jaundice.   Psych: Alert and cooperative, normal mood and affect.   Imaging Studies: No results found.  Assessment and Plan:   Mark Horne is a 22 y.o. y/o male  for nausea vomiting abdominal pain and  diarrhea.  History of marijuana use.  History suggestive of acid reflux.   Plan 1.  Avoid all marijuana use 2.  Increase dose of Prilosec to 40 mg twice a day taken before meals. 3.  Stool test, H. pylori breath test, CBC. 4.  EGD and colonoscopy to evaluate for nausea vomiting to rule out gastric outlet obstruction and colonoscopy for diarrhea. 5.  No NSAID use    Dr Wyline Mood  MD,MRCP [facility]) Follow up in ***

## 2023-10-21 ENCOUNTER — Ambulatory Visit (INDEPENDENT_AMBULATORY_CARE_PROVIDER_SITE_OTHER)

## 2023-10-21 ENCOUNTER — Ambulatory Visit (HOSPITAL_COMMUNITY): Payer: Self-pay

## 2023-10-21 ENCOUNTER — Encounter: Payer: Self-pay | Admitting: Emergency Medicine

## 2023-10-21 ENCOUNTER — Ambulatory Visit
Admission: EM | Admit: 2023-10-21 | Discharge: 2023-10-21 | Disposition: A | Discharge status: Home / Self Care | Attending: Emergency Medicine | Admitting: Emergency Medicine

## 2023-10-21 DIAGNOSIS — R051 Acute cough: Secondary | ICD-10-CM

## 2023-10-21 DIAGNOSIS — J069 Acute upper respiratory infection, unspecified: Secondary | ICD-10-CM

## 2023-10-21 MED ORDER — IPRATROPIUM BROMIDE 0.06 % NA SOLN: 2.0000 | Freq: Four times a day (QID) | NASAL | 12 refills | Status: DC

## 2023-10-21 MED ORDER — PROMETHAZINE-DM 6.25-15 MG/5ML PO SYRP: 5.0000 mL | ORAL_SOLUTION | Freq: Four times a day (QID) | ORAL | 0 refills | Status: DC | PRN

## 2023-10-21 MED ORDER — BENZONATATE 100 MG PO CAPS: 200.0000 mg | ORAL_CAPSULE | Freq: Three times a day (TID) | ORAL | 0 refills | Status: DC

## 2023-10-21 NOTE — ED Provider Notes (Addendum)
 [facility]    CSN: [id number] Arrival date & time: 10/21/23  1015      History   Chief Complaint Chief Complaint  Patient presents with   Sore Throat   Headache    HPI GAYLAN FAUVER is a 23 y.o. male.   HPI  23 year old male with past medical history significant for biceps tendinitis of his right shoulder presents for evaluation of 2 days worth of respiratory symptoms that include headache, fatigue, sweats at night, nasal congestion with clear nasal discharge, left ear drainage, sore throat, deep intermittent cough productive for yellow-green sputum, and shortness of breath.  He denies seeing.  Last week he was at the beach with his sister who was diagnosed with double pneumonia.  History reviewed. No pertinent past medical history.  Patient Active Problem List   Diagnosis Date Noted   Biceps tendinitis of right shoulder 09/03/2015    Past Surgical History:  Procedure Laterality Date   MOUTH SURGERY         Home Medications    Prior to Admission medications   Medication Sig Start Date End Date Taking? Authorizing Provider  benzonatate (TESSALON) 100 MG capsule Take 2 capsules (200 mg total) by mouth every 8 (eight) hours. 10/21/23  Yes Kent Pear, NP  ipratropium (ATROVENT) 0.06 % nasal spray Place 2 sprays into both nostrils 4 (four) times daily. 10/21/23  Yes Kent Pear, NP  promethazine-dextromethorphan (PROMETHAZINE-DM) 6.25-15 MG/5ML syrup Take 5 mLs by mouth 4 (four) times daily as needed. 10/21/23  Yes Kent Pear, NP  albuterol  (VENTOLIN  HFA) 108 (90 Base) MCG/ACT inhaler Inhale 1-2 puffs into the lungs every 6 (six) hours as needed for up to 10 days for wheezing or shortness of breath. 12/03/20 05/27/21  Nancy Axon B, PA-C  cyclobenzaprine  (FLEXERIL ) 10 MG tablet Take 1/2 to 1 whole tablet by mouth every 8 hours as needed for muscle pain/spasms 01/28/22   Carlee Charters, NP  diclofenac (VOLTAREN) 75 MG EC tablet Take 75 mg by mouth 2 (two)  times daily. 01/21/23   [provider]  naproxen  (NAPROSYN ) 500 MG tablet Take 1 tablet (500 mg total) by mouth every 12 (twelve) hours as needed for moderate pain. 01/28/22   Carlee Charters, NP  omeprazole  (PRILOSEC) 40 MG capsule Take 1 capsule (40 mg total) by mouth 2 (two) times daily. 05/27/21   Luke Salaam, MD    Family History Family History  Problem Relation Age of Onset   Healthy Mother    CAD Father 10       CABG x 4   Hypertension Father    CAD Paternal Grandfather 31       CABG x 2   CAD Paternal Uncle        CABG x 1    Social History Social History   Tobacco Use   Smoking status: Some Days    Current packs/day: 1.00    Types: Cigarettes   Smokeless tobacco: Former  Building services engineer status: Former  Substance Use Topics   Alcohol use: Yes    Alcohol/week: 2.0 standard drinks of alcohol    Types: 2 Cans of beer per week    Comment: rarely   Drug use: Never     Allergies   Diclofenac and Nickel   Review of Systems Review of Systems  Constitutional:  Positive for chills, diaphoresis and fatigue. Negative for fever.  HENT:  Positive for congestion, ear discharge, ear pain, rhinorrhea and sore  throat.   Respiratory:  Positive for cough and shortness of breath. Negative for wheezing.      Physical Exam Triage Vital Signs ED Triage Vitals  Encounter Vitals Group     BP      Girls Systolic BP Percentile      Girls Diastolic BP Percentile      Boys Systolic BP Percentile      Boys Diastolic BP Percentile      Pulse      Resp      Temp      Temp src      SpO2      Weight      Height      Head Circumference      Peak Flow      Pain Score      Pain Loc      Pain Education      Exclude from Growth Chart    No data found.  Updated Vital Signs BP 116/71 (BP Location: Right Arm)   Pulse 85   Temp 98.2 F (36.8 C) (Oral)   Resp 16   SpO2 97%   Visual Acuity Right Eye Distance:   Left Eye Distance:   Bilateral Distance:     Right Eye Near:   Left Eye Near:    Bilateral Near:     Physical Exam Vitals and nursing note reviewed.  Constitutional:      Appearance: Normal appearance. He is not ill-appearing.  HENT:     Head: Normocephalic and atraumatic.     Right Ear: Tympanic membrane, ear canal and external ear normal. There is no impacted cerumen.     Left Ear: Ear canal and external ear normal. There is no impacted cerumen.     Ears:     Comments: Mild erythema to the left tympanic membrane.  Right tympanic membrane is pearly gray in appearance.  Both EACs are clear.  Both tympanic membranes are intact.    Nose: Congestion and rhinorrhea present.     Comments: Erythema and edema of bilateral nasal mucosa with clear nasal discharge.    Mouth/Throat:     Mouth: Mucous membranes are moist.     Pharynx: Oropharynx is clear. Posterior oropharyngeal erythema present. No oropharyngeal exudate.     Comments: Erythema and injection to the posterior oropharynx with lymphoid follicles present in the posterior oropharynx.  Cardiovascular:     Rate and Rhythm: Normal rate and regular rhythm.     Pulses: Normal pulses.     Heart sounds: Normal heart sounds. No murmur heard.    No friction rub. No gallop.  Pulmonary:     Effort: Pulmonary effort is normal.     Breath sounds: Normal breath sounds. No wheezing, rhonchi or rales.   Musculoskeletal:     Cervical back: Normal range of motion and neck supple. No tenderness.  Lymphadenopathy:     Cervical: No cervical adenopathy.   Skin:    General: Skin is warm and dry.     Capillary Refill: Capillary refill takes less than 2 seconds.     Findings: No rash.   Neurological:     General: No focal deficit present.     Mental Status: He is alert and oriented to person, place, and time.      UC Treatments / Results  Labs (all labs ordered are listed, but only abnormal results are displayed) Labs Reviewed - No data to display  EKG Normal sinus rhythm with a  ventricular rate of 81 bpm PR interval 132 ms QRS duration 100 ms QT/QTc 338/392 ms T wave inversion in lead V1.  No other ST or T wave abnormalities noted.  Radiology No results found.  Procedures Procedures (including critical care time)  Medications Ordered in UC Medications - No data to display  Initial Impression / Assessment and Plan / UC Course  I have reviewed the triage vital signs and the nursing notes.  Pertinent labs & imaging results that were available during my care of the patient were reviewed by me and considered in my medical decision making (see chart for details).   Patient is a pleasant, nontoxic-appearing 23 year old male presenting for evaluation of 2 days with respiratory symptoms as outlined HPI above.  He was recently around his sister who was being treated for double pneumonia and he is endorsing a occasional, deep, hacking productive cough for a yellow-green sputum.  He is able to speak in full sentence without dyspnea or tachypnea.  Respiratory rate at triage was 16 with 97% room air oxygen saturation.  Lungs are clear to auscultation all fields.  However, given his exposure to double pneumonia and productive cough I will obtain a chest x-ray to rule out any acute cardiopulmonary pathology.  He also reports that he has had elevated heart rate for the past 2 days which he attributes to his Sudafed because it did not start till after his Sudafed.  I did obtain an EKG which shows normal sinus rhythm without any significant abnormality.  He is also reporting drainage from his left ear though there is no appreciable drainage in the left EAC.  Left tympanic membrane does have mild erythema but the TM is intact.  Chest x-ray independently reviewed and evaluated by me.  Impression: Lung fields are well aerated without evidence of infiltrate or effusion.  Cardiomediastinal silhouette appears normal.  Radiology overread is pending. Radiology impression states no acute  cardiopulmonary abnormality.  I will discharge patient with a diagnosis of viral URI with cough with a prescription for Atrovent nasal spray to help with the nasal congestion and Tessalon Perles and (him cough syrup for cough and congestion.  Return precautions reviewed.  Work note provided.   Final Clinical Impressions(s) / UC Diagnoses   Final diagnoses:  Acute cough  Viral URI with cough     Discharge Instructions      Your chest x-ray did not show any evidence of pneumonia.  Based on your physical exam I do believe you have a viral respiratory infection which is causing your symptoms.  Take over-the-counter Tylenol and/or ibuprofen  Corda the pack instructions as needed for any fever or pain.  Use the Atrovent nasal spray, 2 squirts in each nostril every 6 hours, as needed for runny nose and postnasal drip.  Use the Tessalon Perles every 8 hours during the day.  Take them with a small sip of water.  They may give you some numbness to the base of your tongue or a metallic taste in your mouth, this is normal.  Use the Promethazine DM cough syrup at bedtime for cough and congestion.  It will make you drowsy so do not take it during the day.  Return for reevaluation or see your primary care provider for any new or worsening symptoms.      ED Prescriptions     Medication Sig Dispense Auth. Provider   benzonatate (TESSALON) 100 MG capsule Take 2 capsules (200 mg total) by mouth every 8 (eight) hours. 21  capsule Kent Pear, NP   ipratropium (ATROVENT) 0.06 % nasal spray Place 2 sprays into both nostrils 4 (four) times daily. 15 mL Kent Pear, NP   promethazine-dextromethorphan (PROMETHAZINE-DM) 6.25-15 MG/5ML syrup Take 5 mLs by mouth 4 (four) times daily as needed. 118 mL Kent Pear, NP      PDMP not reviewed this encounter.   Kent Pear, NP 10/21/23 1258    Kent Pear, NP 10/21/23 [phone]

## 2023-10-21 NOTE — ED Triage Notes (Addendum)
 Sx x 2 days  Stuffy nose Headache Sore throat  Left ear drainage  Elevated heart rate patient states that he had just taken sudafed   Sister has double pneumonia

## 2023-10-21 NOTE — Discharge Instructions (Signed)
 Your chest x-ray did not show any evidence of pneumonia.  Based on your physical exam I do believe you have a viral respiratory infection which is causing your symptoms.  Take over-the-counter Tylenol and/or ibuprofen  Corda the pack instructions as needed for any fever or pain.  Use the Atrovent nasal spray, 2 squirts in each nostril every 6 hours, as needed for runny nose and postnasal drip.  Use the Tessalon Perles every 8 hours during the day.  Take them with a small sip of water.  They may give you some numbness to the base of your tongue or a metallic taste in your mouth, this is normal.  Use the Promethazine DM cough syrup at bedtime for cough and congestion.  It will make you drowsy so do not take it during the day.  Return for reevaluation or see your primary care provider for any new or worsening symptoms.

## 2024-05-22 ENCOUNTER — Ambulatory Visit
Admission: EM | Admit: 2024-05-22 | Discharge: 2024-05-22 | Disposition: A | Discharge status: Home / Self Care | Attending: Emergency Medicine | Admitting: Emergency Medicine

## 2024-05-22 DIAGNOSIS — L739 Follicular disorder, unspecified: Secondary | ICD-10-CM | POA: Diagnosis not present

## 2024-05-22 DIAGNOSIS — L989 Disorder of the skin and subcutaneous tissue, unspecified: Secondary | ICD-10-CM

## 2024-05-22 MED ORDER — CIPROFLOXACIN HCL 500 MG PO TABS: 500.0000 mg | ORAL_TABLET | Freq: Two times a day (BID) | ORAL | 0 refills | Status: DC

## 2024-05-22 NOTE — Discharge Instructions (Addendum)
 Take the Cipro  twice daily with food for 7 days to cover for potential folliculitis.  Contact your primary care provider and ask for a referral to dermatology.  I have made a referral to [facility] health dermatology, though I am not sure that they will take it given that you have Medicaid.  However, given that they are within [facility] health you may be able to get an appointment to be evaluated to ensure that this lesion is not something more serious.

## 2024-05-22 NOTE — ED Triage Notes (Signed)
 Pt c/o mole on L side of head that's been there for 1 yr. States red,raised & sore. Denies drainage. Req referral to derm.

## 2024-05-22 NOTE — ED Provider Notes (Signed)
 " [facility]    CSN: [id number] Arrival date & time: 05/22/24  0941      History   Chief Complaint Chief Complaint  Patient presents with   Nevus    HPI MANNIX KROEKER is a 24 y.o. male.   HPI  23 year old male with past medical history significant for right biceps tendinitis presents for evaluation of a scalp lesion.  He reports that he noticed the lesion approximately a year ago but yesterday he noticed that it was raised, red, and tender.  No drainage.  No fever.  Requesting referral to dermatology.  History reviewed. No pertinent past medical history.  Patient Active Problem List   Diagnosis Date Noted   Biceps tendinitis of right shoulder 09/03/2015    Past Surgical History:  Procedure Laterality Date   MOUTH SURGERY         Home Medications    Prior to Admission medications  Medication Sig Start Date End Date Taking? Authorizing Provider  ciprofloxacin  (CIPRO ) 500 MG tablet Take 1 tablet (500 mg total) by mouth 2 (two) times daily for 7 days. 05/22/24 05/29/24 Yes Bernardino Ditch, NP  cyclobenzaprine  (FLEXERIL ) 10 MG tablet Take 1/2 to 1 whole tablet by mouth every 8 hours as needed for muscle pain/spasms 01/28/22   Pearl Jenkins Lesches, NP  diclofenac (VOLTAREN) 75 MG EC tablet Take 75 mg by mouth 2 (two) times daily. 01/21/23   [provider]  naproxen  (NAPROSYN ) 500 MG tablet Take 1 tablet (500 mg total) by mouth every 12 (twelve) hours as needed for moderate pain. 01/28/22   Pearl Jenkins Lesches, NP  omeprazole  (PRILOSEC) 40 MG capsule Take 1 capsule (40 mg total) by mouth 2 (two) times daily. 05/27/21   Therisa Bi, MD    Family History Family History  Problem Relation Age of Onset   Healthy Mother    CAD Father 53       CABG x 4   Hypertension Father    CAD Paternal Grandfather 66       CABG x 2   CAD Paternal Uncle        CABG x 1    Social History Social History[1]   Allergies   Diclofenac and Nickel   Review of Systems Review  of Systems  Constitutional:  Negative for fever.  Skin:  Positive for color change and wound.     Physical Exam Triage Vital Signs ED Triage Vitals  Encounter Vitals Group     BP 05/22/24 0949 128/80     Girls Systolic BP Percentile --      Girls Diastolic BP Percentile --      Boys Systolic BP Percentile --      Boys Diastolic BP Percentile --      Pulse Rate 05/22/24 0949 95     Resp 05/22/24 0949 16     Temp 05/22/24 0949 98.6 F (37 C)     Temp Source 05/22/24 0949 Oral     SpO2 05/22/24 0949 97 %     Weight 05/22/24 0947 231 lb 9.6 oz (105.1 kg)     Height --      Head Circumference --      Peak Flow --      Pain Score 05/22/24 0948 0     Pain Loc --      Pain Education --      Exclude from Growth Chart --    No data found.  Updated Vital Signs BP 128/80 (  BP Location: Left Arm)   Pulse 95   Temp 98.6 F (37 C) (Oral)   Resp 16   Wt 231 lb 9.6 oz (105.1 kg)   SpO2 97%   BMI 32.30 kg/m   Visual Acuity Right Eye Distance:   Left Eye Distance:   Bilateral Distance:    Right Eye Near:   Left Eye Near:    Bilateral Near:     Physical Exam Vitals and nursing note reviewed.  Constitutional:      Appearance: Normal appearance. He is not ill-appearing.  HENT:     Head: Normocephalic and atraumatic.  Skin:    General: Skin is warm and dry.     Capillary Refill: Capillary refill takes less than 2 seconds.     Findings: Erythema and lesion present.  Neurological:     General: No focal deficit present.     Mental Status: He is alert and oriented to person, place, and time.      UC Treatments / Results  Labs (all labs ordered are listed, but only abnormal results are displayed) Labs Reviewed - No data to display  EKG   Radiology No results found.  Procedures Procedures (including critical care time)  Medications Ordered in UC Medications - No data to display  Initial Impression / Assessment and Plan / UC Course  I have reviewed the triage  vital signs and the nursing notes.  Pertinent labs & imaging results that were available during my care of the patient were reviewed by me and considered in my medical decision making (see chart for details).  Patient is a pleasant, nontoxic-appearing 24 year old male presenting for evaluation of a scalp lesion just inside the hairline on the left-hand side of his scalp.  He reports that the lesion has been there for approximately a year and was flat.  Yesterday he noticed that it was raised, tender, and red.   As you can see in the image above, the lesion is approximately the size of a nickel and is erythematous and raised.  The patient has Medicaid and I have advised him that I am unable to make a referral that he would have to go through his primary care provider.  If he looks on his Medicaid card it should be listed who his primary care provider is.  Given the abrupt change tenderness, erythema, and the lesion being raised I am suspicious this might be more of an infectious process.  The lesion appears to have regular borders though it is large.  I do feel he should follow-up with dermatology but I will cover for potential folliculitis with a 7-day course of Cipro .      Final Clinical Impressions(s) / UC Diagnoses   Final diagnoses:  Skin lesion of scalp  Folliculitis     Discharge Instructions      Take the Cipro  twice daily with food for 7 days to cover for potential folliculitis.  Contact your primary care provider and ask for a referral to dermatology.  I have made a referral to [facility] health dermatology, though I am not sure that they will take it given that you have Medicaid.  However, given that they are within [facility] you may be able to get an appointment to be evaluated to ensure that this lesion is not something more serious.     ED Prescriptions     Medication Sig Dispense Auth. Provider   ciprofloxacin  (CIPRO ) 500 MG tablet Take 1 tablet (500 mg total) by mouth  2  (two) times daily for 7 days. 14 tablet Bernardino Ditch, NP      PDMP not reviewed this encounter.    [1]  Social History Tobacco Use   Smoking status: Some Days    Current packs/day: 1.00    Types: Cigarettes   Smokeless tobacco: Former  Building Services Engineer status: Former  Substance Use Topics   Alcohol use: Yes    Alcohol/week: 2.0 standard drinks of alcohol    Types: 2 Cans of beer per week    Comment: rarely   Drug use: Never     Bernardino Ditch, NP 05/22/24 1003  "

## 2024-05-23 ENCOUNTER — Encounter: Payer: Self-pay | Admitting: Dermatology

## 2024-05-23 ENCOUNTER — Ambulatory Visit: Admitting: Dermatology

## 2024-05-23 VITALS — BP 122/79

## 2024-05-23 DIAGNOSIS — D2239 Melanocytic nevi of other parts of face: Secondary | ICD-10-CM | POA: Diagnosis not present

## 2024-05-23 DIAGNOSIS — D485 Neoplasm of uncertain behavior of skin: Secondary | ICD-10-CM | POA: Diagnosis not present

## 2024-05-23 DIAGNOSIS — I781 Nevus, non-neoplastic: Secondary | ICD-10-CM

## 2024-05-23 DIAGNOSIS — D224 Melanocytic nevi of scalp and neck: Secondary | ICD-10-CM

## 2024-05-23 NOTE — Patient Instructions (Signed)

## 2024-05-23 NOTE — Progress Notes (Signed)
 "  New Patient Visit   Subjective  Mark Horne is a 24 y.o. male who presents for the following: He has a mole of left scalp that has been there about a year. Last week it got red, raised and was sore. He was seen in Urgent Care yesterday and was given Cipro . He took one tablet yesterday. It is now about half the size as it was yesterday. He also has a mole on his right scalp that he has had for years and he would like to discuss having it removed.  He also has 2 red spots of his left cheek that have been there for ~ a year. They do not have any symptoms. He has tried some OTC creams to get rid of them but nothing has worked.    The following portions of the chart were reviewed this encounter and updated as appropriate: medications, allergies, medical history  Review of Systems:  No other skin or systemic complaints except as noted in HPI or Assessment and Plan.  Objective  Well appearing patient in no apparent distress; mood and affect are within normal limits.   A focused examination was performed of the following areas:   Relevant exam findings are noted in the Assessment and Plan.  Left Frontal Scalp 8 mm pink papule  Right Temple 1.2 cm verrucous papule   Assessment & Plan    Assessment and Plan Left frontal hairline skin lesion for biopsy 8 mm pink verrucous papule on the left frontal hairline, previously flat, now elevated and enlarged. Differential diagnosis includes inflamed seborrheic keratosis, wart, epidermal nevus, non-melanoma skin cancer, and non-pigmented malignant melanoma. No cystic lesion on examination. - Performed shave biopsy to rule out differential diagnoses - Instructed to keep biopsy sites clean with soap and water - Instructed to apply Aquaphor daily to biopsy sites - Will send results via message next week  Epidermal nevus, right temporal scalp 1 cm verrucous papule with cobblestone appearance on the right temporal scalp, consistent with  epidermal nevus. Lesion gets nicked during haircuts. - Performed shave removal of the lesion  Periocular telangiectasias Red spots or blotches along the eye consistent with telangiectasias. Likely due to frequent Valsalva maneuvers from vomiting. Lesions are not dangerous. - Discussed electrodesiccation for cosmetic removal, noting it is not covered by insurance and costs start at $200 for up to 15 lesions NEOPLASM OF UNCERTAIN BEHAVIOR OF SKIN (2) Left Frontal Scalp - Epidermal / dermal shaving  Lesion diameter (cm):  0.8 Informed consent: discussed and consent obtained   Timeout: patient name, date of birth, surgical site, and procedure verified   Procedure prep:  Patient was prepped and draped in usual sterile fashion Prep type:  Isopropyl alcohol Anesthesia: the lesion was anesthetized in a standard fashion   Anesthetic:  1% lidocaine w/ epinephrine 1-100,000 buffered w/ 8.4% NaHCO3 Instrument used: flexible razor blade   Hemostasis achieved with: pressure, aluminum chloride and electrodesiccation   Outcome: patient tolerated procedure well   Post-procedure details: sterile dressing applied and wound care instructions given   Dressing type: bandage and petrolatum    Specimen 1 - Surgical pathology Differential Diagnosis: ISK vs Verruca R/O Amelanotic MM  Check Margins: No Right Temple - Epidermal / dermal shaving  Lesion diameter (cm):  1.2 Informed consent: discussed and consent obtained   Timeout: patient name, date of birth, surgical site, and procedure verified   Procedure prep:  Patient was prepped and draped in usual sterile fashion Prep type:  Isopropyl alcohol  Anesthesia: the lesion was anesthetized in a standard fashion   Anesthetic:  1% lidocaine w/ epinephrine 1-100,000 buffered w/ 8.4% NaHCO3 Instrument used: flexible razor blade   Hemostasis achieved with: pressure, aluminum chloride and electrodesiccation   Outcome: patient tolerated procedure well    Post-procedure details: sterile dressing applied and wound care instructions given   Dressing type: bandage and petrolatum    Specimen 2 - Surgical pathology Differential Diagnosis: Epidermal nevus vs other   Check Margins: No  TELANGIECTASIA Exam: dilated blood vessels of left cheek  Treatment Plan: Benign appearing on exam Call for changes     Return if symptoms worsen or fail to improve.  I, Roseline Hutchinson, CMA, am acting as scribe for [employer], DO .   Documentation: I have reviewed the above documentation for accuracy and completeness, and I agree with the above.  Delon Lenis, DO    "

## 2024-05-24 LAB — SURGICAL PATHOLOGY

## 2024-05-31 ENCOUNTER — Ambulatory Visit: Payer: Self-pay | Admitting: Dermatology
